# Patient Record
Sex: Female | Born: 1955 | Race: Black or African American | Hispanic: No | Marital: Married | Smoking: Former smoker
Health system: Southern US, Community
[De-identification: ages and names within clinical notes are randomized; demographics above are authoritative.]

## PROBLEM LIST (undated history)

## (undated) DIAGNOSIS — J439 Emphysema, unspecified: Secondary | ICD-10-CM

## (undated) DIAGNOSIS — I10 Essential (primary) hypertension: Secondary | ICD-10-CM

## (undated) DIAGNOSIS — B019 Varicella without complication: Secondary | ICD-10-CM

## (undated) DIAGNOSIS — E785 Hyperlipidemia, unspecified: Secondary | ICD-10-CM

## (undated) HISTORY — DX: Hyperlipidemia, unspecified: E78.5

## (undated) HISTORY — PX: DIAGNOSTIC LAPAROSCOPY: SUR761

## (undated) HISTORY — PX: GALLBLADDER SURGERY: SHX652

## (undated) HISTORY — PX: TONSILLECTOMY: SHX5217

## (undated) HISTORY — PX: TONSILLECTOMY: SUR1361

## (undated) HISTORY — PX: CHOLECYSTECTOMY: SHX55

## (undated) HISTORY — DX: Varicella without complication: B01.9

## (undated) HISTORY — DX: Essential (primary) hypertension: I10

## (undated) HISTORY — DX: Emphysema, unspecified: J43.9

---
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
## 1998-02-06 HISTORY — PX: ABDOMINAL HYSTERECTOMY: SHX81

## 2002-02-06 HISTORY — PX: OVARIAN CYST SURGERY: SHX726

## 2017-12-11 ENCOUNTER — Encounter: Payer: Self-pay | Admitting: Family Medicine

## 2017-12-11 ENCOUNTER — Ambulatory Visit (INDEPENDENT_AMBULATORY_CARE_PROVIDER_SITE_OTHER): Payer: 59 | Admitting: Family Medicine

## 2017-12-11 VITALS — BP 98/68 | HR 95 | Temp 98.3°F | Ht 63.0 in | Wt 163.8 lb

## 2017-12-11 DIAGNOSIS — I10 Essential (primary) hypertension: Secondary | ICD-10-CM

## 2017-12-11 DIAGNOSIS — E785 Hyperlipidemia, unspecified: Secondary | ICD-10-CM

## 2017-12-11 DIAGNOSIS — R5383 Other fatigue: Secondary | ICD-10-CM

## 2017-12-11 DIAGNOSIS — R0981 Nasal congestion: Secondary | ICD-10-CM

## 2017-12-11 DIAGNOSIS — R070 Pain in throat: Secondary | ICD-10-CM

## 2017-12-11 DIAGNOSIS — E559 Vitamin D deficiency, unspecified: Secondary | ICD-10-CM

## 2017-12-11 LAB — COMPREHENSIVE METABOLIC PANEL
ALBUMIN: 4.1 g/dL (ref 3.5–5.2)
ALT: 14 U/L (ref 0–35)
AST: 29 U/L (ref 0–37)
Alkaline Phosphatase: 62 U/L (ref 39–117)
BILIRUBIN TOTAL: 1.4 mg/dL — AB (ref 0.2–1.2)
BUN: 15 mg/dL (ref 6–23)
CALCIUM: 10.2 mg/dL (ref 8.4–10.5)
CO2: 32 mEq/L (ref 19–32)
Chloride: 103 mEq/L (ref 96–112)
Creatinine, Ser: 0.98 mg/dL (ref 0.40–1.20)
GFR: 73.3 mL/min (ref >60.00)
Glucose, Bld: 127 mg/dL — ABNORMAL HIGH (ref 70–99)
Potassium: 3 mEq/L — ABNORMAL LOW (ref 3.5–5.1)
SODIUM: 142 meq/L (ref 135–145)
TOTAL PROTEIN: 7.2 g/dL (ref 6.0–8.3)

## 2017-12-11 LAB — CBC WITH DIFFERENTIAL/PLATELET
BASOS ABS: 0.1 10*3/uL (ref 0.0–0.1)
Basophils Relative: 1.4 % (ref 0.0–3.0)
EOS ABS: 0.1 10*3/uL (ref 0.0–0.7)
Eosinophils Relative: 1 % (ref 0.0–5.0)
HEMATOCRIT: 45.9 % (ref 36.0–46.0)
HEMOGLOBIN: 15.6 g/dL — AB (ref 12.0–15.0)
LYMPHS PCT: 26.9 % (ref 12.0–46.0)
Lymphs Abs: 2.5 10*3/uL (ref 0.7–4.0)
MCHC: 33.9 g/dL (ref 30.0–36.0)
MCV: 92.3 fl (ref 78.0–100.0)
MONO ABS: 0.4 10*3/uL (ref 0.1–1.0)
Monocytes Relative: 4.2 % (ref 3.0–12.0)
Neutro Abs: 6.2 10*3/uL (ref 1.4–7.7)
Neutrophils Relative %: 66.5 % (ref 43.0–77.0)
Platelets: 333 10*3/uL (ref 150.0–400.0)
RBC: 4.98 Mil/uL (ref 3.87–5.11)
RDW: 13.7 % (ref 11.5–15.5)
WBC: 9.4 10*3/uL (ref 4.0–10.5)

## 2017-12-11 LAB — LIPID PANEL
CHOLESTEROL: 129 mg/dL (ref 0–200)
HDL: 39.8 mg/dL (ref >39.00)
LDL CALC: 62 mg/dL (ref 0–99)
NonHDL: 89.42
TRIGLYCERIDES: 138 mg/dL (ref 0.0–149.0)
Total CHOL/HDL Ratio: 3
VLDL: 27.6 mg/dL (ref 0.0–40.0)

## 2017-12-11 LAB — B12 AND FOLATE PANEL
Folate: 14 ng/mL (ref >5.9)
VITAMIN B 12: 382 pg/mL (ref 211–911)

## 2017-12-11 LAB — VITAMIN D 25 HYDROXY (VIT D DEFICIENCY, FRACTURES): VITD: 16.28 ng/mL — AB (ref 30.00–100.00)

## 2017-12-11 NOTE — Patient Instructions (Addendum)
Continue claritin and flonase to help post nasal drip  ENT referral done for furhter evaluation of throat pain  Monitor BP at home and follow up here in 1-2 weeks with log of readings.

## 2017-12-11 NOTE — Progress Notes (Signed)
Subjective:    Patient ID: Valerie Larson, female    DOB: 04-23-1955, 62 y.o.   MRN: [id number]  HPI  Patient presents to clinic to establish with new PCP.  Patient has a history of essential hypertension for which she takes amlodipine hydrochlorothiazide.  Patient has history of hyperlipidemia for which she takes atorvastatin.  Patient also uses Flonase and Claritin for seasonal allergies.    Her main concern today is pain in the back of her throat.  She went to urgent care at [facility] clinic last week and was diagnosed with sinus infection.  She was treated with amoxicillin course.  Patient states back down deep in her throat still feels pain, concerned she still has lesions back there and wonders if she has some sort of throat cancer.  Patient has no fever or chills.  Patient's appetite has been normal.  Patient states she stopped using her Flonase and Claritin during the time she was taking her amoxicillin, will restart Flonase and Claritin today.  Past medical history, surgical history, social history and family history reviewed and updated in chart. Patient Active Problem List   Diagnosis Date Noted  . Essential hypertension 12/11/2017  . Hyperlipidemia 12/11/2017   Past Medical History:  Diagnosis Date  . Chicken pox   . Emphysema of lung ([place])   . Hyperlipidemia   . Hypertension    Social History   Tobacco Use  . Smoking status: Former Research scientist (life sciences)  . Smokeless tobacco: Never Used  Substance Use Topics  . Alcohol use: Never    Frequency: Never   Past Surgical History:  Procedure Laterality Date  . ABDOMINAL HYSTERECTOMY  2000  . APPENDECTOMY  1974  . GALLBLADDER SURGERY    . OVARIAN CYST SURGERY  2004  . TONSILLECTOMY     Family History  Problem Relation Age of Onset  . Cancer Mother   . Hyperlipidemia Mother   . Hypertension Mother   . COPD Father   . Diabetes Father   . Hyperlipidemia Father   . Hypertension Sister   . Hypertension Brother     Review of  Systems  Constitutional: Negative for chills, fatigue and fever.  HENT: Negative for congestion, ear pain, sinus pain. +pain in throat.   Eyes: Negative.   Respiratory: Negative for cough, shortness of breath and wheezing.   Cardiovascular: Negative for chest pain, palpitations and leg swelling.  Gastrointestinal: Negative for abdominal pain, diarrhea, nausea and vomiting.  Genitourinary: Negative for dysuria, frequency and urgency.  Musculoskeletal: Negative for arthralgias and myalgias.  Skin: Negative for color change, pallor and rash.  Neurological: Negative for syncope, light-headedness and headaches.  Psychiatric/Behavioral: The patient is not nervous/anxious.       Objective:   Physical Exam  Constitutional: She is oriented to person, place, and time. She appears well-nourished. No distress.  HENT:  Head: Normocephalic and atraumatic.  Right Ear: Tympanic membrane, external ear and ear canal normal.  Left Ear: Tympanic membrane, external ear and ear canal normal.  Nose: Rhinorrhea present.  Mouth/Throat: No oropharyngeal exudate or tonsillar abscesses.  Mild post nasal drip. Patient has had tonsillectomy.   Eyes: Conjunctivae and EOM are normal. No scleral icterus.  Neck: Neck supple. No tracheal deviation present.  Cardiovascular: Normal rate and regular rhythm.  Pulmonary/Chest: Effort normal and breath sounds normal. No respiratory distress.  Musculoskeletal: She exhibits no edema.  Gait normal  Lymphadenopathy:    She has no cervical adenopathy.  Neurological: She is alert and oriented to person,  place, and time.  Skin: Skin is warm and dry. She is not diaphoretic. No erythema. No pallor.  Psychiatric: She has a normal mood and affect. Her behavior is normal.  Nursing note and vitals reviewed.     Vitals:   12/11/17 0856  BP: 98/68  Pulse: 95  Temp: 98.3 F (36.8 C)  SpO2: 95%   Assessment & Plan:   Essential hypertension-patient will continue amlodipine  and hydrochlorothiazide.  Prescription written for blood pressure cuff to monitor pressures at home due to BP being slightly on lower end in clinic today.  She will return to clinic in 1 week with log of readings.  Hyperlipidemia - Patient will continue atorvastatin.  Lipid panel drawn and blood work.  Nasal congestion-patient encouraged to restart Flonase and Claritin.  Pain in throat- I am unable to see any obvious throat abnormality on my exam, but I cannot see down into the back of throat.  We will give patient ENT referral for further evaluation of throat pain.  Patient reassured the amoxicillin she was treated with cover strep throat.  Offered to do repeat throat swab and culture in clinic today, patient declines  Fatigue- CBC, iron studies., CMP, vitamin D, B12, thyroid panel will be checked and lab work today.  Patient will return to clinic in 1 week for recheck of blood pressures and any medication adjustments will be done at that time based on log of her BP readings at home.

## 2017-12-12 LAB — IRON,TIBC AND FERRITIN PANEL
%SAT: 55 % — AB (ref 16–45)
FERRITIN: 262 ng/mL (ref 16–288)
Iron: 143 ug/dL (ref 45–160)
TIBC: 258 ug/dL (ref 250–450)

## 2017-12-12 LAB — THYROID PANEL WITH TSH
Free Thyroxine Index: 3.9 — ABNORMAL HIGH (ref 1.4–3.8)
T3 Uptake: 31 % (ref 22–35)
T4 TOTAL: 12.5 ug/dL — AB (ref 5.1–11.9)
TSH: 1.77 mIU/L (ref 0.40–4.50)

## 2017-12-12 MED ORDER — VITAMIN D (ERGOCALCIFEROL) 1.25 MG (50000 UNIT) PO CAPS: 50000.0000 [IU] | ORAL_CAPSULE | ORAL | 0 refills | Status: DC

## 2017-12-12 NOTE — Addendum Note (Signed)
Addended by: Philis Nettle on: 12/12/2017 01:03 PM   Modules accepted: Orders

## 2017-12-17 NOTE — Progress Notes (Signed)
Subjective:    Patient ID: Valerie Larson, female    DOB: [birth year], 62 y.o.   MRN: [id number]  HPI  Patient presents to clinic to follow up on BP and Potassium levels.  Patient states she takes amlodipine 5mg  daily and atorvastatin 10mg  in a combination pill for her BP control. At last visit she was unclear of her BP med dose, but today we confirmed it is 5mg .   She is supposed to take 71meq of potassium supplement daily, but states there are days where she does not take it because she has a hard time swallowing these pills because they are larger.  Patient would like potassium and a powder form instead.  She still continues to have throat pain in the back of her throat, feels like something is in the back of her throat.  She has ENT appointment scheduled for next week, patient is pleased she was able to get in quickly.  Vit D was low in lab work, she has started Vit D supplement, has taken 1 dose so far  Patient would also like mammogram order, she wants to get it scheduled before the end of the year.  BMP Latest Ref Rng & Units 12/11/2017  Glucose 70 - 99 mg/dL 127(H)  BUN 6 - 23 mg/dL 15  Creatinine 0.40 - 1.20 mg/dL 0.98  Sodium 135 - 145 mEq/L 142  Potassium 3.5 - 5.1 mEq/L 3.0(L)  Chloride 96 - 112 mEq/L 103  CO2 19 - 32 mEq/L 32  Calcium 8.4 - 10.5 mg/dL 10.2    Patient Active Problem List   Diagnosis Date Noted  . Essential hypertension 12/11/2017  . Hyperlipidemia 12/11/2017   Social History   Tobacco Use  . Smoking status: Former Research scientist (life sciences)  . Smokeless tobacco: Never Used  Substance Use Topics  . Alcohol use: Never    Frequency: Never    Review of Systems   Constitutional: Negative for chills, fatigue and fever.  HENT: Negative for congestion, ear pain, sinus pain. +pain in back of throat   Eyes: Negative.   Respiratory: Negative for cough, shortness of breath and wheezing.   Cardiovascular: Negative for chest pain, palpitations and leg swelling.    Gastrointestinal: Negative for abdominal pain, diarrhea, nausea and vomiting.  Genitourinary: Negative for dysuria, frequency and urgency.  Musculoskeletal: Negative for arthralgias and myalgias.  Skin: Negative for color change, pallor and rash.  Neurological: Negative for syncope, light-headedness and headaches.  Psychiatric/Behavioral: The patient is not nervous/anxious.       Objective:   Physical Exam  Constitutional: She appears well-developed and well-nourished. No distress.  Head: Normocephalic and atraumatic.  Eyes: EOM are normal. No scleral icterus.  Nose/throat: No redness or swelling seen in back of throat from what I can visualize with my light Neck: Normal range of motion. Neck supple. No tracheal deviation present.  Cardiovascular: Normal rate, regular rhythm and normal heart sounds.  Declines breast exam in clinic today Pulmonary/Chest: Effort normal and breath sounds normal. No respiratory distress. She has no wheezes. She has no rales.  Neurological: She is alert and oriented to person, place, and time. Gait normal  Skin: Skin is warm and dry. No pallor.  Psychiatric: She has a normal mood and affect. Her behavior is normal. Thought content normal.   Nursing note and vitals reviewed.    Vitals:   12/18/17 1001  BP: 128/74  Pulse: 88  Temp: 98.4 F (36.9 C)  SpO2: 95%   Assessment & Plan:  Vitamin D deficiency-patient will continue vitamin D supplement 50,000 units once per week, will plan to recheck vitamin D levels at her next follow-up visit in approximately 2 months.  Hypokalemia- patient has long history of hypokalemia.  She often cannot swallow the potassium pills due to their size, so we will change formula to a powder that she mixes with liquid that is easy to swallow.  We will recheck basic metabolic panel in clinic today to be sure potassium levels are remaining stable.  Essential hypertension-blood pressure stable on current amlodipine dose of 5  mg.  She will continue this dose.  Throat pain-patient has upcoming appointment with ENT next week.  Mammogram order placed, patient aware she will be contacted to set up appointment.  Follow-up here in approximately 2 months for recheck of chronic conditions.  Patient aware she can call clinic anytime if issues arise.

## 2017-12-18 ENCOUNTER — Encounter: Payer: Self-pay | Admitting: Family Medicine

## 2017-12-18 ENCOUNTER — Ambulatory Visit: Payer: 59 | Admitting: Family Medicine

## 2017-12-18 VITALS — BP 128/74 | HR 88 | Temp 98.4°F | Ht 63.0 in | Wt 167.0 lb

## 2017-12-18 DIAGNOSIS — Z1231 Encounter for screening mammogram for malignant neoplasm of breast: Secondary | ICD-10-CM

## 2017-12-18 DIAGNOSIS — I10 Essential (primary) hypertension: Secondary | ICD-10-CM

## 2017-12-18 DIAGNOSIS — E559 Vitamin D deficiency, unspecified: Secondary | ICD-10-CM | POA: Diagnosis not present

## 2017-12-18 DIAGNOSIS — E876 Hypokalemia: Secondary | ICD-10-CM | POA: Diagnosis not present

## 2017-12-18 DIAGNOSIS — R070 Pain in throat: Secondary | ICD-10-CM

## 2017-12-18 LAB — BASIC METABOLIC PANEL
BUN: 16 mg/dL (ref 6–23)
CO2: 31 meq/L (ref 19–32)
CREATININE: 0.94 mg/dL (ref 0.40–1.20)
Calcium: 9.8 mg/dL (ref 8.4–10.5)
Chloride: 107 mEq/L (ref 96–112)
GFR: 76.91 mL/min (ref >60.00)
GLUCOSE: 118 mg/dL — AB (ref 70–99)
POTASSIUM: 3.5 meq/L (ref 3.5–5.1)
SODIUM: 144 meq/L (ref 135–145)

## 2017-12-18 MED ORDER — POTASSIUM CHLORIDE 20 MEQ PO PACK: 20.0000 meq | PACK | ORAL | 5 refills | Status: DC

## 2018-01-15 ENCOUNTER — Other Ambulatory Visit: Payer: Self-pay | Admitting: Otolaryngology

## 2018-01-15 DIAGNOSIS — R220 Localized swelling, mass and lump, head: Secondary | ICD-10-CM

## 2018-01-15 DIAGNOSIS — R1310 Dysphagia, unspecified: Secondary | ICD-10-CM

## 2018-01-15 DIAGNOSIS — F458 Other somatoform disorders: Secondary | ICD-10-CM

## 2018-02-07 ENCOUNTER — Ambulatory Visit
Admission: RE | Admit: 2018-02-07 | Discharge: 2018-02-07 | Disposition: A | Discharge status: Home / Self Care | Payer: Medicare Other | Source: Ambulatory Visit | Attending: Otolaryngology | Admitting: Otolaryngology

## 2018-02-07 DIAGNOSIS — R220 Localized swelling, mass and lump, head: Secondary | ICD-10-CM | POA: Diagnosis not present

## 2018-02-07 DIAGNOSIS — R1310 Dysphagia, unspecified: Secondary | ICD-10-CM | POA: Insufficient documentation

## 2018-02-07 DIAGNOSIS — F458 Other somatoform disorders: Secondary | ICD-10-CM | POA: Insufficient documentation

## 2018-02-07 DIAGNOSIS — J439 Emphysema, unspecified: Secondary | ICD-10-CM | POA: Diagnosis not present

## 2018-02-07 LAB — POCT I-STAT CREATININE: Creatinine, Ser: 1 mg/dL (ref 0.44–1.00)

## 2018-02-07 MED ORDER — IOHEXOL 300 MG/ML  SOLN
75.0000 mL | Freq: Once | INTRAMUSCULAR | Status: AC | PRN
  Administered 2018-02-07: 75 mL via INTRAVENOUS

## 2018-02-08 DIAGNOSIS — J039 Acute tonsillitis, unspecified: Secondary | ICD-10-CM | POA: Diagnosis not present

## 2018-02-08 DIAGNOSIS — J301 Allergic rhinitis due to pollen: Secondary | ICD-10-CM | POA: Diagnosis not present

## 2018-02-11 ENCOUNTER — Encounter: Payer: Self-pay | Admitting: Family Medicine

## 2018-03-05 ENCOUNTER — Ambulatory Visit (INDEPENDENT_AMBULATORY_CARE_PROVIDER_SITE_OTHER): Payer: Medicare Other | Admitting: Family Medicine

## 2018-03-05 ENCOUNTER — Encounter: Payer: Self-pay | Admitting: Family Medicine

## 2018-03-05 VITALS — BP 132/90 | HR 81 | Temp 98.5°F | Resp 16 | Ht 63.0 in | Wt 162.0 lb

## 2018-03-05 DIAGNOSIS — J069 Acute upper respiratory infection, unspecified: Secondary | ICD-10-CM | POA: Diagnosis not present

## 2018-03-05 DIAGNOSIS — B9789 Other viral agents as the cause of diseases classified elsewhere: Secondary | ICD-10-CM

## 2018-03-05 MED ORDER — ALBUTEROL SULFATE HFA 108 (90 BASE) MCG/ACT IN AERS: 2.0000 | INHALATION_SPRAY | Freq: Four times a day (QID) | RESPIRATORY_TRACT | 2 refills | Status: DC | PRN

## 2018-03-05 NOTE — Progress Notes (Signed)
Subjective:    Patient ID: Valerie Larson, female    DOB: [birth year], 63 y.o.   MRN: [id number]  HPI   Patient presents to clinic complaining of dry cough, feeling congested in chest for the past 3 to 4 days.  Denies any fever or chills.  Denies body aches.  Denies feeling short of breath. Denies nausea/vomiting or diarrhea.  States she has been using Mucinex with some help in reducing cough.  Cough seems worse at night, wheezes at times when laying down.   Patient Active Problem List   Diagnosis Date Noted  . Essential hypertension 12/11/2017  . Hyperlipidemia 12/11/2017   Social History   Tobacco Use  . Smoking status: Former Research scientist (life sciences)  . Smokeless tobacco: Never Used  Substance Use Topics  . Alcohol use: Never    Frequency: Never   Review of Systems  Constitutional: Negative for chills, and fever. Some tiredness, thinks related to cough.  HENT: Negative for congestion, ear pain, sinus pain and sore throat.   Eyes: Negative.   Respiratory: +cough, some chest congestion, some wheezing at night. Negative SOB. Cardiovascular: Negative for chest pain, palpitations and leg swelling.  Gastrointestinal: Negative for abdominal pain, diarrhea, nausea and vomiting.  Genitourinary: Negative for dysuria, frequency and urgency.  Musculoskeletal: Negative for arthralgias and myalgias.  Skin: Negative for color change, pallor and rash.  Neurological: Negative for syncope, light-headedness and headaches.  Psychiatric/Behavioral: The patient is not nervous/anxious.       Objective:   Physical Exam Vitals signs and nursing note reviewed.  Constitutional:      General: She is not in acute distress.    Appearance: She is not ill-appearing, toxic-appearing or diaphoretic.  HENT:     Head: Normocephalic and atraumatic.     Nose: Rhinorrhea (some clear nasal drainage) present.     Mouth/Throat:     Mouth: Mucous membranes are moist.     Pharynx: No oropharyngeal exudate or posterior  oropharyngeal erythema.  Eyes:     General: No scleral icterus.       Right eye: No discharge.        Left eye: No discharge.     Extraocular Movements: Extraocular movements intact.     Conjunctiva/sclera: Conjunctivae normal.     Pupils: Pupils are equal, round, and reactive to light.  Neck:     Musculoskeletal: Neck supple. No neck rigidity.  Cardiovascular:     Rate and Rhythm: Normal rate and regular rhythm.  Pulmonary:     Effort: Pulmonary effort is normal. No respiratory distress.     Breath sounds: No wheezing, rhonchi or rales.  Musculoskeletal:     Right lower leg: No edema.     Left lower leg: No edema.  Lymphadenopathy:     Cervical: No cervical adenopathy.  Skin:    General: Skin is warm and dry.     Coloration: Skin is not pale.  Neurological:     Mental Status: She is alert and oriented to person, place, and time.  Psychiatric:        Mood and Affect: Mood normal.        Behavior: Behavior normal.    Vitals:   03/05/18 1342  BP: 132/90  Pulse: 81  Resp: 16  Temp: 98.5 F (36.9 C)  SpO2: 95%      Assessment & Plan:   Viral URI with cough- patient lungs are clear on exam today.  However, due to report of some wheezing night when laying  we will prescribe albuterol inhaler to use as needed.  Advised to continue taking Mucinex as she has been doing, this is a great over-the-counter choice to help reduce cough and thin secretions.  Advised to get plenty of rest, increase fluid intake and do good handwashing.   Symptoms should resolve over the next 1 to 2 weeks.  Advised if they are not improving in this timeframe, to call office for reevaluation and chest x-ray imaging.  Patient otherwise will keep her already planned follow-up in February 2020.

## 2018-03-15 ENCOUNTER — Encounter: Payer: Self-pay | Admitting: Radiology

## 2018-03-15 ENCOUNTER — Ambulatory Visit
Admission: RE | Admit: 2018-03-15 | Discharge: 2018-03-15 | Disposition: A | Discharge status: Home / Self Care | Payer: Medicare Other | Source: Ambulatory Visit | Attending: Family Medicine | Admitting: Family Medicine

## 2018-03-15 DIAGNOSIS — Z1231 Encounter for screening mammogram for malignant neoplasm of breast: Secondary | ICD-10-CM | POA: Insufficient documentation

## 2018-03-20 ENCOUNTER — Ambulatory Visit: Payer: 59 | Admitting: Family Medicine

## 2018-05-13 ENCOUNTER — Other Ambulatory Visit: Payer: Self-pay | Admitting: Family Medicine

## 2018-05-13 DIAGNOSIS — B9789 Other viral agents as the cause of diseases classified elsewhere: Principal | ICD-10-CM

## 2018-05-13 DIAGNOSIS — J069 Acute upper respiratory infection, unspecified: Secondary | ICD-10-CM

## 2018-06-08 ENCOUNTER — Other Ambulatory Visit: Payer: Self-pay | Admitting: Family Medicine

## 2018-06-08 DIAGNOSIS — I10 Essential (primary) hypertension: Secondary | ICD-10-CM

## 2018-06-08 DIAGNOSIS — E7800 Pure hypercholesterolemia, unspecified: Secondary | ICD-10-CM

## 2018-06-17 DIAGNOSIS — K219 Gastro-esophageal reflux disease without esophagitis: Secondary | ICD-10-CM | POA: Diagnosis not present

## 2018-06-17 DIAGNOSIS — J301 Allergic rhinitis due to pollen: Secondary | ICD-10-CM | POA: Diagnosis not present

## 2018-06-17 DIAGNOSIS — R0683 Snoring: Secondary | ICD-10-CM | POA: Diagnosis not present

## 2018-06-27 DIAGNOSIS — G4730 Sleep apnea, unspecified: Secondary | ICD-10-CM | POA: Diagnosis not present

## 2018-08-29 ENCOUNTER — Other Ambulatory Visit: Payer: Self-pay

## 2018-08-29 DIAGNOSIS — Z20822 Contact with and (suspected) exposure to covid-19: Secondary | ICD-10-CM

## 2018-08-29 DIAGNOSIS — R6889 Other general symptoms and signs: Secondary | ICD-10-CM | POA: Diagnosis not present

## 2018-08-31 ENCOUNTER — Other Ambulatory Visit: Payer: Self-pay | Admitting: Family Medicine

## 2018-09-01 ENCOUNTER — Telehealth: Payer: Self-pay

## 2018-09-01 LAB — NOVEL CORONAVIRUS, NAA: SARS-CoV-2, NAA: NOT DETECTED

## 2018-09-01 NOTE — Telephone Encounter (Signed)
Pt called for test results. Pt informed that results are not back. Pt sent sign up link for MyChart.

## 2018-09-02 ENCOUNTER — Telehealth: Payer: Self-pay

## 2018-09-02 ENCOUNTER — Other Ambulatory Visit: Payer: Self-pay | Admitting: Family Medicine

## 2018-09-02 DIAGNOSIS — E7800 Pure hypercholesterolemia, unspecified: Secondary | ICD-10-CM

## 2018-09-02 DIAGNOSIS — I10 Essential (primary) hypertension: Secondary | ICD-10-CM

## 2018-09-02 NOTE — Telephone Encounter (Signed)
Pt called [facility] Called and spoke to pt.    Pt said that her husband tested positive for COVID and the only symptoms that her husband had was a fever.  Pt wants to be advised since she tested negative for COVID.  Pt denied having any symptoms.  Only allergy symptoms:  nasal congestion, sinus pressure and drainage.

## 2018-09-02 NOTE — Telephone Encounter (Signed)
Called Pt she stated she understood with no questions. Pt also stated she didn't need a visit at this time, she is not having any symptoms

## 2018-09-02 NOTE — Telephone Encounter (Signed)
Called and spoke to pt.    Pt said that her husband tested positive for COVID and the only symptoms that her husband had was a fever.  Pt wants to be advised since she tested negative for COVID.  Pt denied having any symptoms.  Only allergy symptoms:  nasal congestion, sinus pressure and drainage.

## 2018-09-02 NOTE — Telephone Encounter (Signed)
Copied from [place] [id number]. Topic: General - Other >> Sep 02, 2018 10:11 AM Celene Kras A wrote: Reason for CRM: Pt called stating her husband tested positive although she tested nedgative for covid. Pt is requesting advice on how to handle this. Please advise.

## 2018-09-02 NOTE — Telephone Encounter (Signed)
We can do a visit to discuss her concerns  If she is negative and fever free, then she can wear a mask if out of the home, washing hands, social distancing. If she wants to err on extreme side of caution she can quarantine the same time frame as husband.   I do not think re-testing her is needed unless she is feeling worse or having other new symptoms

## 2018-09-02 NOTE — Telephone Encounter (Signed)
Her results are negative and looks like they were released to her mychart account also.

## 2018-09-02 NOTE — Telephone Encounter (Signed)
I called Pt and her concerns are that her husband is positive. Should she get re tested since her results were negative. Pt wanted to know what should she be doing to keep herself safe. We basically talked about wearing a mask, social distance, disinfectant be careful.

## 2018-09-28 ENCOUNTER — Other Ambulatory Visit: Payer: Self-pay | Admitting: Family Medicine

## 2018-10-27 ENCOUNTER — Other Ambulatory Visit: Payer: Self-pay | Admitting: Family Medicine

## 2018-12-01 ENCOUNTER — Other Ambulatory Visit: Payer: Self-pay | Admitting: Family Medicine

## 2018-12-01 DIAGNOSIS — I10 Essential (primary) hypertension: Secondary | ICD-10-CM

## 2018-12-01 DIAGNOSIS — E7800 Pure hypercholesterolemia, unspecified: Secondary | ICD-10-CM

## 2018-12-02 ENCOUNTER — Other Ambulatory Visit: Payer: Self-pay | Admitting: Family Medicine

## 2018-12-04 DIAGNOSIS — H353132 Nonexudative age-related macular degeneration, bilateral, intermediate dry stage: Secondary | ICD-10-CM | POA: Diagnosis not present

## 2018-12-26 ENCOUNTER — Other Ambulatory Visit: Payer: Self-pay | Admitting: Family Medicine

## 2019-01-20 ENCOUNTER — Encounter: Payer: Self-pay | Admitting: Family

## 2019-01-20 ENCOUNTER — Other Ambulatory Visit: Payer: Self-pay

## 2019-01-20 ENCOUNTER — Ambulatory Visit (INDEPENDENT_AMBULATORY_CARE_PROVIDER_SITE_OTHER): Payer: Medicare Other | Admitting: Family

## 2019-01-20 VITALS — Ht 63.0 in | Wt 162.0 lb

## 2019-01-20 DIAGNOSIS — I10 Essential (primary) hypertension: Secondary | ICD-10-CM | POA: Diagnosis not present

## 2019-01-20 DIAGNOSIS — Z1211 Encounter for screening for malignant neoplasm of colon: Secondary | ICD-10-CM

## 2019-01-20 DIAGNOSIS — Z716 Tobacco abuse counseling: Secondary | ICD-10-CM | POA: Insufficient documentation

## 2019-01-20 DIAGNOSIS — Z0000 Encounter for general adult medical examination without abnormal findings: Secondary | ICD-10-CM | POA: Insufficient documentation

## 2019-01-20 DIAGNOSIS — Z1231 Encounter for screening mammogram for malignant neoplasm of breast: Secondary | ICD-10-CM | POA: Diagnosis not present

## 2019-01-20 DIAGNOSIS — E7800 Pure hypercholesterolemia, unspecified: Secondary | ICD-10-CM

## 2019-01-20 DIAGNOSIS — J069 Acute upper respiratory infection, unspecified: Secondary | ICD-10-CM

## 2019-01-20 DIAGNOSIS — Z1239 Encounter for other screening for malignant neoplasm of breast: Secondary | ICD-10-CM | POA: Insufficient documentation

## 2019-01-20 MED ORDER — ALBUTEROL SULFATE HFA 108 (90 BASE) MCG/ACT IN AERS: INHALATION_SPRAY | RESPIRATORY_TRACT | 3 refills | Status: DC

## 2019-01-20 MED ORDER — HYDROCHLOROTHIAZIDE 25 MG PO TABS: 25.0000 mg | ORAL_TABLET | Freq: Every day | ORAL | 1 refills | Status: DC

## 2019-01-20 MED ORDER — AMLODIPINE-ATORVASTATIN 5-10 MG PO TABS: 1.0000 | ORAL_TABLET | Freq: Every day | ORAL | 1 refills | Status: DC

## 2019-01-20 NOTE — Assessment & Plan Note (Signed)
Overdue, GI referral placed

## 2019-01-20 NOTE — Progress Notes (Signed)
Virtual Visit via Video Note  I connected with@  on 01/20/19 at  2:00 PM EST by a video enabled telemedicine application and verified that I am speaking with the correct person using two identifiers.  Location patient: home Location provider:home office Persons participating in the virtual visit: patient, provider  I discussed the limitations of evaluation and management by telemedicine and the availability of in person appointments. The patient expressed understanding and agreed to proceed.   HPI: Establish care, follow up.  Feels well today, no complaints Would like refills.  COPD - feels well controlled. Once and while needs inhaler.   HTN- stays around 128/78. Also takes KCl.   Denies exertional chest pain or pressure, numbness or tingling radiating to left arm or jaw, palpitations, dizziness, frequent headaches, changes in vision, or shortness of breath.    HLD-compliant with medication   Overdue for mammogram, colonoscopy, bone density, pneumonia vaccine.   ROS: See pertinent positives and negatives per HPI.  Past Medical History:  Diagnosis Date  . Chicken pox   . Emphysema of lung ([place])   . Hyperlipidemia   . Hypertension     Past Surgical History:  Procedure Laterality Date  . ABDOMINAL HYSTERECTOMY  2000   complete hysterectomy, no ovaries.   . APPENDECTOMY  1974  . GALLBLADDER SURGERY    . OVARIAN CYST SURGERY  2004  . TONSILLECTOMY      Family History  Problem Relation Age of Onset  . Cancer Mother   . Hyperlipidemia Mother   . Hypertension Mother   . Ovarian cancer Mother 42  . COPD Father   . Diabetes Father   . Hyperlipidemia Father   . Hypertension Sister   . Hypertension Brother     SOCIAL HX: former smoker   Current Outpatient Medications:  .  albuterol (VENTOLIN HFA) 108 (90 Base) MCG/ACT inhaler, TAKE 2 PUFFS BY MOUTH EVERY 6 HOURS AS NEEDED FOR WHEEZE OR SHORTNESS OF BREATH, Disp: 18 g, Rfl: 3 .  amLODipine-atorvastatin (CADUET)  5-10 MG tablet, Take 1 tablet by mouth daily., Disp: 90 tablet, Rfl: 1 .  azelastine (ASTELIN) 0.1 % nasal spray, SMARTSIG:2 Spray(s) Both Nares Every 12 Hours PRN, Disp: , Rfl:  .  hydrochlorothiazide (HYDRODIURIL) 25 MG tablet, Take 1 tablet (25 mg total) by mouth daily., Disp: 90 tablet, Rfl: 1 .  loratadine (CLARITIN) 10 MG tablet, Take 10 mg by mouth daily., Disp: , Rfl:  .  Multiple Vitamins-Minerals (PRESERVISION AREDS 2+MULTI VIT PO), Take 1 tablet by mouth., Disp: , Rfl:  .  potassium chloride (KLOR-CON) 20 MEQ packet, Take 20 mEq by mouth every other day., Disp: 30 packet, Rfl: 5 .  omeprazole (PRILOSEC) 40 MG capsule, SMARTSIG:1 Pill By Mouth Every Morning, Disp: , Rfl:   EXAM:  VITALS per patient if applicable: Today's Vitals   01/20/19 1350  Weight: 162 lb (73.5 kg)  Height: 5\' 3"  (1.6 m)   Body mass index is 28.7 kg/m.  BP Readings from Last 3 Encounters:  03/05/18 132/90  12/18/17 128/74  12/11/17 98/68   GENERAL: alert, oriented, appears well and in no acute distress  HEENT: atraumatic, conjunttiva clear, no obvious abnormalities on inspection of external nose and ears  NECK: normal movements of the head and neck  LUNGS: on inspection no signs of respiratory distress, breathing rate appears normal, no obvious gross SOB, gasping or wheezing  CV: no obvious cyanosis  MS: moves all visible extremities without noticeable abnormality  PSYCH/NEURO: pleasant and cooperative, no  obvious depression or anxiety, speech and thought processing grossly intact  ASSESSMENT AND PLAN:  Discussed the following assessment and plan:  Essential hypertension - Plan: DG Bone Density, CBC with Differential/Platelet, Comprehensive metabolic panel-FUTURE, Hemoglobin A1c, Lipid panel-future, TSH-future, T3, free, T4, free, VITAMIN D 25 Hydroxy (Vit-D Deficiency, Fractures)  Encounter for screening mammogram for malignant neoplasm of breast - Plan: 3D mammogram- MM SCREENING BREAST  TOMO BILATERAL, DG Bone Density  Screen for colon cancer - Plan: Colonoscopy-Ambulatory referral to Gastroenterology-45 to 75 years  Problem List Items Addressed This Visit      Cardiovascular and Mediastinum   Essential hypertension - Primary    Appears controlled, advised patient to continue check at home.  Refilled medications and pending lab work.      Relevant Orders   DG Bone Density   CBC with Differential/Platelet   Comprehensive metabolic panel-FUTURE   Hemoglobin A1c   Lipid panel-future   TSH-future   T3, free   T4, free   VITAMIN D 25 Hydroxy (Vit-D Deficiency, Fractures)     Other   Screening for breast cancer    Ordered mammogram and bone density, patient understands to schedule both      Relevant Orders   3D mammogram- MM SCREENING BREAST TOMO BILATERAL   DG Bone Density   Screen for colon cancer    Overdue, GI referral placed      Relevant Orders   Colonoscopy-Ambulatory referral to Gastroenterology-45 to 79 years    Of note, patient will bring immunization record at her next visit so we can rectify pneumonia vaccine  -we discussed possible serious and likely etiologies, options for evaluation and workup, limitations of telemedicine visit vs in person visit, treatment, treatment risks and precautions. Pt prefers to treat via telemedicine empirically rather then risking or undertaking an in person visit at this moment. Patient agrees to seek prompt in person care if worsening, new symptoms arise, or if is not improving with treatment.   I discussed the assessment and treatment plan with the patient. The patient was provided an opportunity to ask questions and all were answered. The patient agreed with the plan and demonstrated an understanding of the instructions.   The patient was advised to call back or seek an in-person evaluation if the symptoms worsen or if the condition fails to improve as anticipated.   Mable Paris, FNP

## 2019-01-20 NOTE — Assessment & Plan Note (Signed)
Ordered mammogram and bone density, patient understands to schedule both

## 2019-01-20 NOTE — Patient Instructions (Addendum)
We need pneumonia vaccine history; bring this to the office.   Please call call and schedule your 3D mammogram, bone density scan as discussed.   [place]  [place], [place]    Stay safe!

## 2019-01-20 NOTE — Assessment & Plan Note (Signed)
Appears controlled, advised patient to continue check at home.  Refilled medications and pending lab work.

## 2019-02-12 ENCOUNTER — Encounter: Payer: Self-pay | Admitting: Family

## 2019-02-12 ENCOUNTER — Ambulatory Visit (INDEPENDENT_AMBULATORY_CARE_PROVIDER_SITE_OTHER): Payer: Medicare Other

## 2019-02-12 ENCOUNTER — Other Ambulatory Visit (INDEPENDENT_AMBULATORY_CARE_PROVIDER_SITE_OTHER): Payer: Medicare Other

## 2019-02-12 ENCOUNTER — Other Ambulatory Visit: Payer: Self-pay

## 2019-02-12 DIAGNOSIS — I10 Essential (primary) hypertension: Secondary | ICD-10-CM | POA: Diagnosis not present

## 2019-02-12 DIAGNOSIS — Z23 Encounter for immunization: Secondary | ICD-10-CM | POA: Diagnosis not present

## 2019-02-12 LAB — CBC WITH DIFFERENTIAL/PLATELET
Basophils Absolute: 0.1 10*3/uL (ref 0.0–0.1)
Basophils Relative: 1.4 % (ref 0.0–3.0)
Eosinophils Absolute: 0.2 10*3/uL (ref 0.0–0.7)
Eosinophils Relative: 2.7 % (ref 0.0–5.0)
HCT: 43.5 % (ref 36.0–46.0)
Hemoglobin: 14.6 g/dL (ref 12.0–15.0)
Lymphocytes Relative: 45.6 % (ref 12.0–46.0)
Lymphs Abs: 3.3 10*3/uL (ref 0.7–4.0)
MCHC: 33.5 g/dL (ref 30.0–36.0)
MCV: 92.9 fl (ref 78.0–100.0)
Monocytes Absolute: 0.5 10*3/uL (ref 0.1–1.0)
Monocytes Relative: 7.1 % (ref 3.0–12.0)
Neutro Abs: 3.1 10*3/uL (ref 1.4–7.7)
Neutrophils Relative %: 43.2 % (ref 43.0–77.0)
Platelets: 329 10*3/uL (ref 150.0–400.0)
RBC: 4.68 Mil/uL (ref 3.87–5.11)
RDW: 13.2 % (ref 11.5–15.5)
WBC: 7.3 10*3/uL (ref 4.0–10.5)

## 2019-02-12 LAB — COMPREHENSIVE METABOLIC PANEL
ALT: 10 U/L (ref 0–35)
AST: 17 U/L (ref 0–37)
Albumin: 3.8 g/dL (ref 3.5–5.2)
Alkaline Phosphatase: 62 U/L (ref 39–117)
BUN: 10 mg/dL (ref 6–23)
CO2: 33 mEq/L — ABNORMAL HIGH (ref 19–32)
Calcium: 9.9 mg/dL (ref 8.4–10.5)
Chloride: 103 mEq/L (ref 96–112)
Creatinine, Ser: 0.95 mg/dL (ref 0.40–1.20)
GFR: 71.23 mL/min (ref >60.00)
Glucose, Bld: 109 mg/dL — ABNORMAL HIGH (ref 70–99)
Potassium: 3.8 mEq/L (ref 3.5–5.1)
Sodium: 140 mEq/L (ref 135–145)
Total Bilirubin: 1.2 mg/dL (ref 0.2–1.2)
Total Protein: 7 g/dL (ref 6.0–8.3)

## 2019-02-12 LAB — LIPID PANEL
Cholesterol: 146 mg/dL (ref 0–200)
HDL: 36.2 mg/dL — ABNORMAL LOW (ref >39.00)
LDL Cholesterol: 70 mg/dL (ref 0–99)
NonHDL: 109.67
Total CHOL/HDL Ratio: 4
Triglycerides: 198 mg/dL — ABNORMAL HIGH (ref 0.0–149.0)
VLDL: 39.6 mg/dL (ref 0.0–40.0)

## 2019-02-12 LAB — TSH: TSH: 3.16 u[IU]/mL (ref 0.35–4.50)

## 2019-02-12 LAB — T4, FREE: Free T4: 0.95 ng/dL (ref 0.60–1.60)

## 2019-02-12 LAB — HEMOGLOBIN A1C: Hgb A1c MFr Bld: 5.8 % (ref 4.6–6.5)

## 2019-02-12 LAB — VITAMIN D 25 HYDROXY (VIT D DEFICIENCY, FRACTURES): VITD: 14.74 ng/mL — ABNORMAL LOW (ref 30.00–100.00)

## 2019-02-12 LAB — T3, FREE: T3, Free: 3.3 pg/mL (ref 2.3–4.2)

## 2019-02-14 ENCOUNTER — Encounter: Payer: Self-pay | Admitting: *Deleted

## 2019-02-20 ENCOUNTER — Telehealth: Payer: Self-pay | Admitting: Family

## 2019-02-20 NOTE — Telephone Encounter (Signed)
I called patient & viewed lab results. They had been sent through mychart. Patient verbalized understanding.

## 2019-02-20 NOTE — Telephone Encounter (Signed)
Pt was calling for lab results from a week ago. Patient stated she called yesterday and no one called her back.

## 2019-02-26 ENCOUNTER — Other Ambulatory Visit: Payer: Medicare Other

## 2019-02-26 ENCOUNTER — Ambulatory Visit: Payer: Medicare Other

## 2019-03-17 DIAGNOSIS — Z23 Encounter for immunization: Secondary | ICD-10-CM | POA: Diagnosis not present

## 2019-04-05 DIAGNOSIS — Z23 Encounter for immunization: Secondary | ICD-10-CM | POA: Diagnosis not present

## 2019-04-15 ENCOUNTER — Ambulatory Visit (INDEPENDENT_AMBULATORY_CARE_PROVIDER_SITE_OTHER): Payer: Medicare Other

## 2019-04-15 ENCOUNTER — Other Ambulatory Visit: Payer: Self-pay

## 2019-04-15 VITALS — Ht 63.0 in | Wt 162.0 lb

## 2019-04-15 DIAGNOSIS — Z0000 Encounter for general adult medical examination without abnormal findings: Secondary | ICD-10-CM | POA: Diagnosis not present

## 2019-04-15 DIAGNOSIS — Z1211 Encounter for screening for malignant neoplasm of colon: Secondary | ICD-10-CM | POA: Diagnosis not present

## 2019-04-15 NOTE — Patient Instructions (Addendum)
Valerie Larson , Thank you for taking time to come for your Medicare Wellness Visit. I appreciate your ongoing commitment to your health goals. Please review the following plan we discussed and let me know if I can assist you in the future.   These are the goals we discussed: Goals      Patient Stated   . I want to walk more for exercise and lose weight (pt-stated)       This is a list of the screening recommended for you and due dates:  Health Maintenance  Topic Date Due  . Colon Cancer Screening  03/10/1971  . Tetanus Vaccine  03/09/1972  . DEXA scan (bone density measurement)  03/09/2018  . Pneumonia vaccines (1 of 2 - PCV13) 03/09/2018  . Mammogram  03/15/2020  . Flu Shot  Completed  .  Hepatitis C: One time screening is recommended by Center for Disease Control  (CDC) for  adults born from [birth year] through 1967.   Completed    Colonoscopy, Adult A colonoscopy is a procedure to look at the entire large intestine. This procedure is done using a long, thin, flexible tube that has a camera on the end. You may have a colonoscopy:  As a part of normal colorectal screening.  If you have certain symptoms, such as: ? A low number of red blood cells in your blood (anemia). ? Diarrhea that does not go away. ? Pain in your abdomen. ? Blood in your stool. A colonoscopy can help screen for and diagnose medical problems, including:  Tumors.  Extra tissue that grows where mucus forms (polyps).  Inflammation.  Areas of bleeding. Tell your health care provider about:  Any allergies you have.  All medicines you are taking, including vitamins, herbs, eye drops, creams, and over-the-counter medicines.  Any problems you or family members have had with anesthetic medicines.  Any blood disorders you have.  Any surgeries you have had.  Any medical conditions you have.  Any problems you have had with having bowel movements.  Whether you are pregnant or may be pregnant. What are the  risks? Generally, this is a safe procedure. However, problems may occur, including:  Bleeding.  Damage to your intestine.  Allergic reactions to medicines given during the procedure.  Infection. This is rare. What happens before the procedure? Eating and drinking restrictions Follow instructions from your health care provider about eating or drinking restrictions, which may include:  A few days before the procedure: ? Follow a low-fiber diet. ? Avoid nuts, seeds, dried fruit, raw fruits, and vegetables.  1-3 days before the procedure: ? Eat only gelatin dessert or ice pops. ? Drink only clear liquids, such as water, clear juice, clear broth or bouillon, black coffee or tea, or clear soft drinks or sports drinks. ? Avoid liquids that contain red or purple dye.  The day of the procedure: ? Do not eat solid foods. You may continue to drink clear liquids until up to 2 hours before the procedure. ? Do not eat or drink anything starting 2 hours before the procedure, or within the time period that your health care provider recommends. Bowel prep If you were prescribed a bowel prep to take by mouth (orally) to clean out your colon:  Take it as told by your health care provider. Starting the day before your procedure, you will need to drink a large amount of liquid medicine. The liquid will cause you to have many bowel movements of loose stool until your  stool becomes almost clear or light green.  If your skin or the opening between the buttocks (anus) gets irritated from diarrhea, you may relieve the irritation using: ? Wipes with medicine in them, such as adult wet wipes with aloe and vitamin E. ? A product to soothe skin, such as petroleum jelly.  If you vomit while drinking the bowel prep: ? Take a break for up to 60 minutes. ? Begin the bowel prep again. ? Call your health care provider if you keep vomiting or you cannot take the bowel prep without vomiting.  To clean out your  colon, you may also be given: ? Laxative medicines. These help you have a bowel movement. ? Instructions for enema use. An enema is liquid medicine injected into your rectum. Medicines Ask your health care provider about:  Changing or stopping your regular medicines or supplements. This is especially important if you are taking iron supplements, diabetes medicines, or blood thinners.  Taking medicines such as aspirin and ibuprofen. These medicines can thin your blood. Do not take these medicines unless your health care provider tells you to take them.  Taking over-the-counter medicines, vitamins, herbs, and supplements. General instructions  Ask your health care provider what steps will be taken to help prevent infection. These may include washing skin with a germ-killing soap.  Plan to have someone take you home from the hospital or clinic. What happens during the procedure?   An IV will be inserted into one of your veins.  You may be given one or more of the following: ? A medicine to help you relax (sedative). ? A medicine to numb the area (local anesthetic). ? A medicine to make you fall asleep (general anesthetic). This is rarely needed.  You will lie on your side with your knees bent.  The tube will: ? Have oil or gel put on it (be lubricated). ? Be inserted into your anus. ? Be gently eased through all parts of your large intestine.  Air will be sent into your colon to keep it open. This may cause some pressure or cramping.  Images will be taken with the camera and will appear on a screen.  A small tissue sample may be removed to be looked at under a microscope (biopsy). The tissue may be sent to a lab for testing if any signs of problems are found.  If small polyps are found, they may be removed and checked for cancer cells.  When the procedure is finished, the tube will be removed. The procedure may vary among health care providers and hospitals. What happens after  the procedure?  Your blood pressure, heart rate, breathing rate, and blood oxygen level will be monitored until you leave the hospital or clinic.  You may have a small amount of blood in your stool.  You may pass gas and have mild cramping or bloating in your abdomen. This is caused by the air that was used to open your colon during the exam.  Do not drive for 24 hours after the procedure.  It is up to you to get the results of your procedure. Ask your health care provider, or the department that is doing the procedure, when your results will be ready. Summary  A colonoscopy is a procedure to look at the entire large intestine.  Follow instructions from your health care provider about eating and drinking before the procedure.  If you were prescribed an oral bowel prep to clean out your colon, take it  as told by your health care provider.  During the colonoscopy, a flexible tube with a camera on its end is inserted into the anus and then passed into the other parts of the large intestine. This information is not intended to replace advice given to you by your health care provider. Make sure you discuss any questions you have with your health care provider. Document Revised: 08/16/2018 Document Reviewed: 08/16/2018 Elsevier Patient Education  [employer].

## 2019-04-15 NOTE — Progress Notes (Addendum)
Subjective:   Valerie Larson is a 64 y.o. female who presents for an Initial Medicare Annual Wellness Visit.  Review of Systems    No ROS.  Medicare Wellness Virtual Visit.  Visual/audio telehealth visit, UTA vital signs.   See social history for additional risk factors.    Cardiac Risk Factors include: advanced age (>58men, >56 women);hypertension     Objective:    Today's Vitals   04/15/19 0907  Weight: 162 lb (73.5 kg)   Body mass index is 28.7 kg/m.  Advanced Directives 04/15/2019  Does Patient Have a Medical Advance Directive? No  Would patient like information on creating a medical advance directive? No - Patient declined    Current Medications (verified) Outpatient Encounter Medications as of 04/15/2019  Medication Sig  . albuterol (VENTOLIN HFA) 108 (90 Base) MCG/ACT inhaler TAKE 2 PUFFS BY MOUTH EVERY 6 HOURS AS NEEDED FOR WHEEZE OR SHORTNESS OF BREATH  . amLODipine-atorvastatin (CADUET) 5-10 MG tablet Take 1 tablet by mouth daily.  Marland Kitchen azelastine (ASTELIN) 0.1 % nasal spray SMARTSIG:2 Spray(s) Both Nares Every 12 Hours PRN  . hydrochlorothiazide (HYDRODIURIL) 25 MG tablet Take 1 tablet (25 mg total) by mouth daily.  Marland Kitchen loratadine (CLARITIN) 10 MG tablet Take 10 mg by mouth daily.  . Multiple Vitamins-Minerals (PRESERVISION AREDS 2+MULTI VIT PO) Take 1 tablet by mouth.  Marland Kitchen omeprazole (PRILOSEC) 40 MG capsule SMARTSIG:1 Pill By Mouth Every Morning  . potassium chloride (KLOR-CON) 20 MEQ packet Take 20 mEq by mouth every other day.   No facility-administered encounter medications on file as of 04/15/2019.    Allergies (verified) Patient has no known allergies.   History: Past Medical History:  Diagnosis Date  . Chicken pox   . Emphysema of lung ([place])   . Hyperlipidemia   . Hypertension    Past Surgical History:  Procedure Laterality Date  . ABDOMINAL HYSTERECTOMY  2000   complete hysterectomy, no ovaries.   . APPENDECTOMY  1974  . GALLBLADDER SURGERY    .  OVARIAN CYST SURGERY  2004  . TONSILLECTOMY     Family History  Problem Relation Age of Onset  . Cancer Mother   . Hyperlipidemia Mother   . Hypertension Mother   . Ovarian cancer Mother 63  . COPD Father   . Diabetes Father   . Hyperlipidemia Father   . Hypertension Sister   . Hypertension Brother    Social History   Socioeconomic History  . Marital status: Married    Spouse name: Not on file  . Number of children: Not on file  . Years of education: Not on file  . Highest education level: Not on file  Occupational History  . Not on file  Tobacco Use  . Smoking status: Former Research scientist (life sciences)  . Smokeless tobacco: Never Used  Substance and Sexual Activity  . Alcohol use: Never  . Drug use: Never  . Sexual activity: Not on file  Other Topics Concern  . Not on file  Social History Narrative  . Not on file   Social Determinants of Health   Financial Resource Strain:   . Difficulty of Paying Living Expenses: Not on file  Food Insecurity:   . Worried About Charity fundraiser in the Last Year: Not on file  . Ran Out of Food in the Last Year: Not on file  Transportation Needs:   . Lack of Transportation (Medical): Not on file  . Lack of Transportation (Non-Medical): Not on file  Physical Activity:   .  Days of Exercise per Week: Not on file  . Minutes of Exercise per Session: Not on file  Stress:   . Feeling of Stress : Not on file  Social Connections:   . Frequency of Communication with Friends and Family: Not on file  . Frequency of Social Gatherings with Friends and Family: Not on file  . Attends Religious Services: Not on file  . Active Member of Clubs or Organizations: Not on file  . Attends Archivist Meetings: Not on file  . Marital Status: Not on file    Tobacco Counseling Counseling given: Not Answered   Clinical Intake:  Pre-visit preparation completed: Yes        Diabetes: No  How often do you need to have someone help you when you read  instructions, pamphlets, or other written materials from your doctor or pharmacy?: 1 - Never  Interpreter Needed?: No      Activities of Daily Living In your present state of health, do you have any difficulty performing the following activities: 04/15/2019 01/20/2019  Hearing? N N  Vision? N N  Difficulty concentrating or making decisions? N N  Walking or climbing stairs? N N  Dressing or bathing? N N  Doing errands, shopping? N N  Preparing Food and eating ? N -  Using the Toilet? N -  In the past six months, have you accidently leaked urine? N -  Do you have problems with loss of bowel control? N -  Managing your Medications? N -  Managing your Finances? N -  Housekeeping or managing your Housekeeping? N -  Some recent data might be hidden     Immunizations and Health Maintenance Immunization History  Administered Date(s) Administered  . Fluad Quad(high Dose 65+) 02/12/2019  . PFIZER SARS-COV-2 Vaccination 03/17/2019, 04/05/2019  . Pneumococcal Polysaccharide-23 02/11/2007   Health Maintenance Due  Topic Date Due  . COLONOSCOPY  03/10/1971  . TETANUS/TDAP  03/09/1972  . DEXA SCAN  03/09/2018  . PNA vac Low Risk Adult (1 of 2 - PCV13) 03/09/2018    Patient Care Team: Burnard Hawthorne, FNP as PCP - General (Family Medicine)  Indicate any recent Medical Services you may have received from other than [facility] providers in the past year (date may be approximate).     Assessment:   This is a routine wellness examination for [name].  Nurse connected with patient 04/15/19 at  9:00 AM EST by a telephone enabled telemedicine application and verified that I am speaking with the correct person using two identifiers. Patient stated full name and DOB. Patient gave permission to continue with virtual visit. Patient's location was at home and Nurse's location was at [place] office.   Patient is alert and oriented x3. Patient denies difficulty focusing or concentrating. Patient  likes to read, play games shows and complete online puzzles for brain stimulation.   Health Maintenance Due: -Prevnar vaccine- discussed; to be completed with doctor in visit or local pharmacy.  -Dexa Scan- plans to schedule -Mammogram- plans to schedule See completed HM at the end of note.   Eye: Visual acuity not assessed. Virtual visit. Followed by their ophthalmologist.  Dental: Visits every 6 months.    Hearing: Demonstrates normal hearing during visit.  Safety:  Patient feels safe at home- yes Patient does have smoke detectors at home- yes Patient does wear sunscreen or protective clothing when in direct sunlight - yes Patient does wear seat belt when in a moving vehicle - yes Patient drives-  yes Adequate lighting in walkways free from debris- yes Grab bars and handrails used as appropriate- yes Ambulates with an assistive device- no  Social: Alcohol intake - no       Smoking history- former   Smokers in home? none Illicit drug use? none  Medication: Taking as directed and without issues.  Self managed - yes   Covid-19: Precautions and sickness symptoms discussed. Wears mask, social distancing, hand hygiene as appropriate.   Activities of Daily Living Patient denies needing assistance with: household chores, feeding themselves, getting from bed to chair, getting to the toilet, bathing/showering, dressing, managing money, or preparing meals.   Discussed the importance of a healthy diet, water intake and the benefits of aerobic exercise.   Physical activity- active around the home, no routine.   Diet:  Regular Water: fair intake Caffeine: 1 cup of coffee, 1 pepsi   Other Providers Patient Care Team: Burnard Hawthorne, FNP as PCP - General (Family Medicine)  Hearing/Vision screen  Hearing Screening   125Hz  250Hz  500Hz  1000Hz  2000Hz  3000Hz  4000Hz  6000Hz  8000Hz   Right ear:           Left ear:           Comments: Patient is able to hear conversational  tones without difficulty.  No issues reported.  Vision Screening Comments: Wears corrective lenses Visual acuity not assessed, virtual visit.  They have seen their ophthalmologist in the last 12 months.     Dietary issues and exercise activities discussed: Current Exercise Habits: Home exercise routine, Intensity: Mild  Goals      Patient Stated   . I want to walk more for exercise and lose weight (pt-stated)      Depression Screen PHQ 2/9 Scores 04/15/2019 01/20/2019  PHQ - 2 Score 0 0    Fall Risk Fall Risk  04/15/2019 01/20/2019  Falls in the past year? 0 0  Follow up Falls evaluation completed Falls evaluation completed    Timed Get Up and Go Performed no, virtual visit  Cognitive Function: MMSE - Mini Mental State Exam 04/15/2019  Not completed: Unable to complete        Screening Tests Health Maintenance  Topic Date Due  . COLONOSCOPY  03/10/1971  . TETANUS/TDAP  03/09/1972  . DEXA SCAN  03/09/2018  . PNA vac Low Risk Adult (1 of 2 - PCV13) 03/09/2018  . MAMMOGRAM  03/15/2020  . INFLUENZA VACCINE  Completed  . Hepatitis C Screening  Completed      Plan:    Keep all routine maintenance appointments.   Follow up 04/23/19 @ 9:00  Medicare Attestation I have personally reviewed: The patient's medical and social history Their use of alcohol, tobacco or illicit drugs Their current medications and supplements The patient's functional ability including ADLs,fall risks, home safety risks, cognitive, and hearing and visual impairment Diet and physical activities Evidence for depression   I have reviewed and discussed with patient certain preventive protocols, quality metrics, and best practice recommendations. Arby Barrette, Jeremiyah Cullens L, LPN   [id number]    Agree with plan. Mable Paris, NP

## 2019-04-17 ENCOUNTER — Other Ambulatory Visit: Payer: Self-pay

## 2019-04-17 ENCOUNTER — Telehealth: Payer: Self-pay

## 2019-04-17 DIAGNOSIS — Z1211 Encounter for screening for malignant neoplasm of colon: Secondary | ICD-10-CM

## 2019-04-17 NOTE — Telephone Encounter (Signed)
Gastroenterology Pre-Procedure Review  Request Date: Thursday 05/22/19 Requesting Physician: Dr. Vicente Males  PATIENT REVIEW QUESTIONS: The patient responded to the following health history questions as indicated:    1. Are you having any GI issues? no 2. Do you have a personal history of Polyps? yes (10 years ago) 3. Do you have a family history of Colon Cancer or Polyps? no 4. Diabetes Mellitus? no 5. Joint replacements in the past 12 months?no 6. Major health problems in the past 3 months?no 7. Any artificial heart valves, MVP, or defibrillator?no    MEDICATIONS & ALLERGIES:    Patient reports the following regarding taking any anticoagulation/antiplatelet therapy:   Plavix, Coumadin, Eliquis, Xarelto, Lovenox, Pradaxa, Brilinta, or Effient? no Aspirin? no  Patient confirms/reports the following medications:  Current Outpatient Medications  Medication Sig Dispense Refill  . albuterol (VENTOLIN HFA) 108 (90 Base) MCG/ACT inhaler TAKE 2 PUFFS BY MOUTH EVERY 6 HOURS AS NEEDED FOR WHEEZE OR SHORTNESS OF BREATH 18 g 3  . amLODipine-atorvastatin (CADUET) 5-10 MG tablet Take 1 tablet by mouth daily. 90 tablet 1  . azelastine (ASTELIN) 0.1 % nasal spray SMARTSIG:2 Spray(s) Both Nares Every 12 Hours PRN    . hydrochlorothiazide (HYDRODIURIL) 25 MG tablet Take 1 tablet (25 mg total) by mouth daily. 90 tablet 1  . loratadine (CLARITIN) 10 MG tablet Take 10 mg by mouth daily.    . Multiple Vitamins-Minerals (PRESERVISION AREDS 2+MULTI VIT PO) Take 1 tablet by mouth.    Marland Kitchen omeprazole (PRILOSEC) 40 MG capsule SMARTSIG:1 Pill By Mouth Every Morning    . potassium chloride (KLOR-CON) 20 MEQ packet Take 20 mEq by mouth every other day. 30 packet 5   No current facility-administered medications for this visit.    Patient confirms/reports the following allergies:  No Known Allergies  No orders of the defined types were placed in this encounter.   AUTHORIZATION INFORMATION Primary  Insurance: 1D#: Group #:  Secondary Insurance: 1D#: Group #:  SCHEDULE INFORMATION: Date: Thursday 05/22/19 Time: Location:[facility]

## 2019-04-23 ENCOUNTER — Other Ambulatory Visit: Payer: Self-pay

## 2019-04-23 ENCOUNTER — Encounter: Payer: Self-pay | Admitting: Family

## 2019-04-23 ENCOUNTER — Telehealth (INDEPENDENT_AMBULATORY_CARE_PROVIDER_SITE_OTHER): Payer: Medicare Other | Admitting: Family

## 2019-04-23 VITALS — BP 126/77 | Ht 63.0 in | Wt 167.0 lb

## 2019-04-23 DIAGNOSIS — Z780 Asymptomatic menopausal state: Secondary | ICD-10-CM | POA: Diagnosis not present

## 2019-04-23 DIAGNOSIS — Z13820 Encounter for screening for osteoporosis: Secondary | ICD-10-CM | POA: Diagnosis not present

## 2019-04-23 DIAGNOSIS — Z1231 Encounter for screening mammogram for malignant neoplasm of breast: Secondary | ICD-10-CM | POA: Diagnosis not present

## 2019-04-23 DIAGNOSIS — I10 Essential (primary) hypertension: Secondary | ICD-10-CM | POA: Diagnosis not present

## 2019-04-23 NOTE — Progress Notes (Signed)
Virtual Visit via Video Note  I connected with@  on 04/23/19 at  9:00 AM EDT by a video enabled telemedicine application and verified that I am speaking with the correct person using two identifiers.  Location patient: home Location provider:work  Persons participating in the virtual visit: patient, provider  I discussed the limitations of evaluation and management by telemedicine and the availability of in person appointments. The patient expressed understanding and agreed to proceed.   HPI: Feels well today.  Follow-up blood pressure. Blood pressure at home 126/77 today. Denies exertional chest pain or pressure, numbness or tingling radiating to left arm or jaw, palpitations, dizziness, frequent headaches, changes in vision, or shortness of breath.   She has been taking vitamin D.  Colonoscopy scheduled  MM due.   ROS: See pertinent positives and negatives per HPI.  Past Medical History:  Diagnosis Date  . Chicken pox   . Emphysema of lung ([place])   . Hyperlipidemia   . Hypertension     Past Surgical History:  Procedure Laterality Date  . ABDOMINAL HYSTERECTOMY  2000   complete hysterectomy, no ovaries.   . APPENDECTOMY  1974  . GALLBLADDER SURGERY    . OVARIAN CYST SURGERY  2004  . TONSILLECTOMY      Family History  Problem Relation Age of Onset  . Cancer Mother   . Hyperlipidemia Mother   . Hypertension Mother   . Ovarian cancer Mother 70  . COPD Father   . Diabetes Father   . Hyperlipidemia Father   . Hypertension Sister   . Hypertension Brother        Current Outpatient Medications:  .  albuterol (VENTOLIN HFA) 108 (90 Base) MCG/ACT inhaler, TAKE 2 PUFFS BY MOUTH EVERY 6 HOURS AS NEEDED FOR WHEEZE OR SHORTNESS OF BREATH, Disp: 18 g, Rfl: 3 .  amLODipine-atorvastatin (CADUET) 5-10 MG tablet, Take 1 tablet by mouth daily., Disp: 90 tablet, Rfl: 1 .  azelastine (ASTELIN) 0.1 % nasal spray, SMARTSIG:2 Spray(s) Both Nares Every 12 Hours PRN, Disp: , Rfl:  .   cholecalciferol (VITAMIN D3) 25 MCG (1000 UNIT) tablet, Take 1,000 Units by mouth daily., Disp: , Rfl:  .  hydrochlorothiazide (HYDRODIURIL) 25 MG tablet, Take 1 tablet (25 mg total) by mouth daily., Disp: 90 tablet, Rfl: 1 .  loratadine (CLARITIN) 10 MG tablet, Take 10 mg by mouth daily., Disp: , Rfl:  .  Multiple Vitamins-Minerals (PRESERVISION AREDS 2+MULTI VIT PO), Take 1 tablet by mouth., Disp: , Rfl:  .  omeprazole (PRILOSEC) 40 MG capsule, SMARTSIG:1 Pill By Mouth Every Morning, Disp: , Rfl:  .  potassium chloride (KLOR-CON) 20 MEQ packet, Take 20 mEq by mouth every other day., Disp: 30 packet, Rfl: 5  EXAM:  VITALS per patient if applicable: BP Readings from Last 3 Encounters:  04/23/19 126/77  03/05/18 132/90  12/18/17 128/74   GENERAL: alert, oriented, appears well and in no acute distress  HEENT: atraumatic, conjunttiva clear, no obvious abnormalities on inspection of external nose and ears  NECK: normal movements of the head and neck  LUNGS: on inspection no signs of respiratory distress, breathing rate appears normal, no obvious gross SOB, gasping or wheezing  CV: no obvious cyanosis  MS: moves all visible extremities without noticeable abnormality  PSYCH/NEURO: pleasant and cooperative, no obvious depression or anxiety, speech and thought processing grossly intact  ASSESSMENT AND PLAN:  Discussed the following assessment and plan:  Screening for osteoporosis - Plan: DG Bone Density  Asymptomatic menopausal state -  Plan: DG Bone Density  Essential hypertension  Encounter for screening mammogram for malignant neoplasm of breast Problem List Items Addressed This Visit      Cardiovascular and Mediastinum   Essential hypertension    Stable, controlled.  Continue amlodipine ,hydrochlorothiazide        Other   Screening for breast cancer    Patient will schedule mammogram along with bone density  a month after her last Covid vaccine.       Other Visit  Diagnoses    Screening for osteoporosis    -  Primary   Relevant Orders   DG Bone Density   Asymptomatic menopausal state       Relevant Orders   DG Bone Density      -we discussed possible serious and likely etiologies, options for evaluation and workup, limitations of telemedicine visit vs in person visit, treatment, treatment risks and precautions. Pt prefers to treat via telemedicine empirically rather then risking or undertaking an in person visit at this moment. Patient agrees to seek prompt in person care if worsening, new symptoms arise, or if is not improving with treatment.   I discussed the assessment and treatment plan with the patient. The patient was provided an opportunity to ask questions and all were answered. The patient agreed with the plan and demonstrated an understanding of the instructions.   The patient was advised to call back or seek an in-person evaluation if the symptoms worsen or if the condition fails to improve as anticipated.   Mable Paris, FNP

## 2019-04-23 NOTE — Assessment & Plan Note (Signed)
Stable, controlled.  Continue amlodipine ,hydrochlorothiazide

## 2019-04-23 NOTE — Assessment & Plan Note (Addendum)
Patient will schedule mammogram along with bone density  a month after her last Covid vaccine.

## 2019-05-19 ENCOUNTER — Other Ambulatory Visit: Payer: Self-pay

## 2019-05-19 ENCOUNTER — Telehealth: Payer: Self-pay

## 2019-05-19 MED ORDER — PEG 3350-KCL-NA BICARB-NACL 420 G PO SOLR: 4000.0000 mL | Freq: Once | ORAL | 0 refills | Status: AC

## 2019-05-19 NOTE — Telephone Encounter (Signed)
Patient insurance will not accept prep prescription. Ins will approve CLENPIQ. Please send new prescription to [facility] on [address] [place]

## 2019-05-19 NOTE — Telephone Encounter (Signed)
ClenPiq rx has been called in to the pharmacy.  LVM on physicians line.  Thanks,  [place], [place]

## 2019-05-20 ENCOUNTER — Other Ambulatory Visit: Payer: Self-pay

## 2019-05-20 ENCOUNTER — Other Ambulatory Visit
Admission: RE | Admit: 2019-05-20 | Discharge: 2019-05-20 | Disposition: A | Discharge status: Home / Self Care | Payer: Medicare Other | Source: Ambulatory Visit | Attending: Gastroenterology | Admitting: Gastroenterology

## 2019-05-20 DIAGNOSIS — Z01812 Encounter for preprocedural laboratory examination: Secondary | ICD-10-CM | POA: Diagnosis not present

## 2019-05-20 DIAGNOSIS — Z20822 Contact with and (suspected) exposure to covid-19: Secondary | ICD-10-CM | POA: Insufficient documentation

## 2019-05-20 LAB — SARS CORONAVIRUS 2 (TAT 6-24 HRS): SARS Coronavirus 2: NEGATIVE

## 2019-05-21 ENCOUNTER — Encounter: Payer: Self-pay | Admitting: Gastroenterology

## 2019-05-22 ENCOUNTER — Ambulatory Visit: Payer: Medicare Other | Admitting: Certified Registered"

## 2019-05-22 ENCOUNTER — Ambulatory Visit
Admission: RE | Admit: 2019-05-22 | Discharge: 2019-05-22 | Disposition: A | Discharge status: Home / Self Care | Payer: Medicare Other | Attending: Gastroenterology | Admitting: Gastroenterology

## 2019-05-22 ENCOUNTER — Other Ambulatory Visit: Payer: Self-pay

## 2019-05-22 ENCOUNTER — Encounter
Admission: RE | Disposition: A | Discharge status: Home / Self Care | Payer: Self-pay | Source: Home / Self Care | Attending: Gastroenterology

## 2019-05-22 ENCOUNTER — Encounter: Payer: Self-pay | Admitting: Gastroenterology

## 2019-05-22 DIAGNOSIS — I10 Essential (primary) hypertension: Secondary | ICD-10-CM | POA: Insufficient documentation

## 2019-05-22 DIAGNOSIS — Z86010 Personal history of colonic polyps: Secondary | ICD-10-CM | POA: Diagnosis not present

## 2019-05-22 DIAGNOSIS — K640 First degree hemorrhoids: Secondary | ICD-10-CM | POA: Insufficient documentation

## 2019-05-22 DIAGNOSIS — Z79899 Other long term (current) drug therapy: Secondary | ICD-10-CM | POA: Diagnosis not present

## 2019-05-22 DIAGNOSIS — F17200 Nicotine dependence, unspecified, uncomplicated: Secondary | ICD-10-CM | POA: Diagnosis not present

## 2019-05-22 DIAGNOSIS — Z87891 Personal history of nicotine dependence: Secondary | ICD-10-CM | POA: Insufficient documentation

## 2019-05-22 DIAGNOSIS — K635 Polyp of colon: Secondary | ICD-10-CM | POA: Diagnosis not present

## 2019-05-22 DIAGNOSIS — Z1211 Encounter for screening for malignant neoplasm of colon: Secondary | ICD-10-CM | POA: Insufficient documentation

## 2019-05-22 DIAGNOSIS — D122 Benign neoplasm of ascending colon: Secondary | ICD-10-CM | POA: Diagnosis not present

## 2019-05-22 DIAGNOSIS — J439 Emphysema, unspecified: Secondary | ICD-10-CM | POA: Insufficient documentation

## 2019-05-22 DIAGNOSIS — D120 Benign neoplasm of cecum: Secondary | ICD-10-CM | POA: Insufficient documentation

## 2019-05-22 DIAGNOSIS — E785 Hyperlipidemia, unspecified: Secondary | ICD-10-CM | POA: Insufficient documentation

## 2019-05-22 HISTORY — PX: COLONOSCOPY WITH PROPOFOL: SHX5780

## 2019-05-22 SURGERY — COLONOSCOPY WITH PROPOFOL
Anesthesia: General

## 2019-05-22 MED ORDER — SODIUM CHLORIDE 0.9 % IV SOLN: INTRAVENOUS | Status: DC | PRN

## 2019-05-22 MED ORDER — SODIUM CHLORIDE 0.9 % IV SOLN: INTRAVENOUS | Status: DC

## 2019-05-22 MED ORDER — LIDOCAINE HCL (CARDIAC) PF 100 MG/5ML IV SOSY
PREFILLED_SYRINGE | INTRAVENOUS | Status: DC | PRN
  Administered 2019-05-22: 100 mg via INTRATRACHEAL

## 2019-05-22 MED ORDER — EPHEDRINE SULFATE 50 MG/ML IJ SOLN
INTRAMUSCULAR | Status: DC | PRN
  Administered 2019-05-22 (×4): 5 mg via INTRAVENOUS

## 2019-05-22 MED ORDER — PROPOFOL 10 MG/ML IV BOLUS
INTRAVENOUS | Status: DC | PRN
  Administered 2019-05-22: 50 mg via INTRAVENOUS
  Administered 2019-05-22: 10 mg via INTRAVENOUS

## 2019-05-22 MED ORDER — PROPOFOL 500 MG/50ML IV EMUL
INTRAVENOUS | Status: DC | PRN
  Administered 2019-05-22: 155 ug/kg/min via INTRAVENOUS

## 2019-05-22 MED ORDER — PHENYLEPHRINE HCL (PRESSORS) 10 MG/ML IV SOLN: INTRAVENOUS | Status: DC | PRN

## 2019-05-22 NOTE — Transfer of Care (Signed)
Immediate Anesthesia Transfer of Care Note  Patient: [place]  Procedure(s) Performed: COLONOSCOPY WITH PROPOFOL (N/A )  Patient Location: Endoscopy Unit  Anesthesia Type:General  Level of Consciousness: drowsy, patient cooperative and responds to stimulation  Airway & Oxygen Therapy: Patient Spontanous Breathing and Patient connected to face mask oxygen  Post-op Assessment: Report given to RN and Post -op Vital signs reviewed and stable  Post vital signs: Reviewed and stable  Last Vitals:  Vitals Value Taken Time  BP 90/57 05/22/19 0858  Temp 36.3 C 05/22/19 0857  Pulse 108 05/22/19 0858  Resp 18 05/22/19 0858  SpO2 97 % 05/22/19 0858  Vitals shown include unvalidated device data.  Last Pain:  Vitals:   05/22/19 0857  TempSrc: Temporal         Complications: No apparent anesthesia complications

## 2019-05-22 NOTE — Op Note (Signed)
[facility]enter[facility] Patient Name: [facility] Procedure Date: 05/22/2019 8:17 AM MRN: [id number] Account #: [id number] Date of Birth: [birth year] Admit Type: Outpatient Age: 64 Room: [facility] ENDO ROOM 1 Gender: Female Note Status: Finalized Procedure:             Colonoscopy Indications:           High risk colon cancer surveillance: Personal history                         of colonic polyps Providers:             Jonathon Bellows MD, MD Medicines:             Monitored Anesthesia Care Complications:         No immediate complications. Procedure:             Pre-Anesthesia Assessment:                        - Prior to the procedure, a History and Physical was                         performed, and patient medications, allergies and                         sensitivities were reviewed. The patient's tolerance                         of previous anesthesia was reviewed.                        - The risks and benefits of the procedure and the                         sedation options and risks were discussed with the                         patient. All questions were answered and informed                         consent was obtained.                        - ASA Grade Assessment: II - A patient with mild                         systemic disease.                        After obtaining informed consent, the colonoscope was                         passed under direct vision. Throughout the procedure,                         the patient's blood pressure, pulse, and oxygen                         saturations were monitored continuously. The  Colonoscope was introduced through the anus and                         advanced to the the cecum, identified by the                         appendiceal orifice. The colonoscopy was performed                         with ease. The patient tolerated the procedure well.                         The quality of the  bowel preparation was excellent. Findings:      The perianal and digital rectal examinations were normal.      Non-bleeding internal hemorrhoids were found during retroflexion. The       hemorrhoids were large and Grade I (internal hemorrhoids that do not       prolapse).      Two sessile polyps were found in the sigmoid colon and cecum. The polyps       were 4 to 6 mm in size. These polyps were removed with a cold snare.       Resection and retrieval were complete.      Three sessile polyps were found in the ascending colon. The polyps were       4 to 6 mm in size. These polyps were removed with a cold snare.       Resection and retrieval were complete.      The exam was otherwise without abnormality on direct and retroflexion       views. Impression:            - Non-bleeding internal hemorrhoids.                        - Two 4 to 6 mm polyps in the sigmoid colon and in the                         cecum, removed with a cold snare. Resected and                         retrieved.                        - Three 4 to 6 mm polyps in the ascending colon,                         removed with a cold snare. Resected and retrieved.                        - The examination was otherwise normal on direct and                         retroflexion views. Recommendation:        - Discharge patient to home (with escort).                        - Resume previous diet.                        -  Continue present medications.                        - Await pathology results.                        - Repeat colonoscopy for surveillance based on                         pathology results. Procedure Code(s):     --- Professional ---                        [phone], Colonoscopy, flexible; with removal of                         tumor(s), polyp(s), or other lesion(s) by snare                         technique Diagnosis Code(s):     --- Professional ---                        Z86.010, Personal history of colonic  polyps                        K63.5, Polyp of colon                        K64.0, First degree hemorrhoids CPT copyright 2019 American Medical Association. All rights reserved. The codes documented in this report are preliminary and upon coder review may  be revised to meet current compliance requirements. Jonathon Bellows, MD Jonathon Bellows MD, MD 05/22/2019 8:57:24 AM This report has been signed electronically. Number of Addenda: 0 Note Initiated On: 05/22/2019 8:17 AM Scope Withdrawal Time: 0 hours 11 minutes 40 seconds  Total Procedure Duration: 0 hours 18 minutes 18 seconds  Estimated Blood Loss:  Estimated blood loss: none.      [facility]

## 2019-05-22 NOTE — Anesthesia Postprocedure Evaluation (Signed)
Anesthesia Post Note  Patient: Valerie Larson  Procedure(s) Performed: COLONOSCOPY WITH PROPOFOL (N/A )  Patient location during evaluation: Endoscopy Anesthesia Type: General Level of consciousness: awake and alert and oriented Pain management: pain level controlled Vital Signs Assessment: post-procedure vital signs reviewed and stable Respiratory status: spontaneous breathing Cardiovascular status: blood pressure returned to baseline Anesthetic complications: no     Last Vitals:  Vitals:   05/22/19 0815 05/22/19 0857  BP: (!) 140/94   Pulse: 71   Resp: 18   Temp: (!) 35.7 C (!) 36.3 C  SpO2: 99%     Last Pain:  Vitals:   05/22/19 0927  TempSrc:   PainSc: 0-No pain                 Rayford Williamsen

## 2019-05-22 NOTE — H&P (Signed)
Jonathon Bellows, MD [address], [place], [place], [place], [address] [address], [place], [place], [place], [address] Phone: [phone]  Fax: [phone]  Primary Care Physician:  Burnard Hawthorne, FNP   Pre-Procedure History & Physical: HPI:  [place] is a 64 y.o. female is here for an colonoscopy.   Past Medical History:  Diagnosis Date  . Chicken pox   . Emphysema of lung ([place])   . Hyperlipidemia   . Hypertension     Past Surgical History:  Procedure Laterality Date  . ABDOMINAL HYSTERECTOMY  2000   complete hysterectomy, no ovaries.   . APPENDECTOMY  1974  . CHOLECYSTECTOMY    . DIAGNOSTIC LAPAROSCOPY    . GALLBLADDER SURGERY    . OVARIAN CYST SURGERY  2004  . TONSILLECTOMY    . TONSILLECTOMY      Prior to Admission medications   Medication Sig Start Date End Date Taking? Authorizing Provider  amLODipine-atorvastatin (CADUET) 5-10 MG tablet Take 1 tablet by mouth daily. 01/20/19  Yes Burnard Hawthorne, FNP  azelastine (ASTELIN) 0.1 % nasal spray SMARTSIG:2 Spray(s) Both Nares Every 12 Hours PRN 01/09/19  Yes [provider]  hydrochlorothiazide (HYDRODIURIL) 25 MG tablet Take 1 tablet (25 mg total) by mouth daily. 01/20/19  Yes Arnett, Yvetta Coder, FNP  loratadine (CLARITIN) 10 MG tablet Take 10 mg by mouth daily.   Yes [provider]  albuterol (VENTOLIN HFA) 108 (90 Base) MCG/ACT inhaler TAKE 2 PUFFS BY MOUTH EVERY 6 HOURS AS NEEDED FOR WHEEZE OR SHORTNESS OF BREATH 01/20/19   Burnard Hawthorne, FNP  cholecalciferol (VITAMIN D3) 25 MCG (1000 UNIT) tablet Take 1,000 Units by mouth daily.    [provider]  Multiple Vitamins-Minerals (PRESERVISION AREDS 2+MULTI VIT PO) Take 1 tablet by mouth.    [provider]  omeprazole (PRILOSEC) 40 MG capsule SMARTSIG:1 Pill By Mouth Every Morning 12/30/18   [provider]  potassium chloride (KLOR-CON) 20 MEQ packet Take 20 mEq by mouth every other day. 12/18/17    Jodelle Green, FNP    Allergies as of 04/18/2019  . (No Known Allergies)    Family History  Problem Relation Age of Onset  . Cancer Mother   . Hyperlipidemia Mother   . Hypertension Mother   . Ovarian cancer Mother 38  . COPD Father   . Diabetes Father   . Hyperlipidemia Father   . Hypertension Sister   . Hypertension Brother     Social History   Socioeconomic History  . Marital status: Married    Spouse name: Not on file  . Number of children: Not on file  . Years of education: Not on file  . Highest education level: Not on file  Occupational History  . Not on file  Tobacco Use  . Smoking status: Current Every Day Smoker  . Smokeless tobacco: Never Used  Substance and Sexual Activity  . Alcohol use: Never  . Drug use: Never  . Sexual activity: Not on file  Other Topics Concern  . Not on file  Social History Narrative   Grandson 3 , diagnosed with leukemia at 2. He is doing well, but she still worries about him.    Social Determinants of Health   Financial Resource Strain:   . Difficulty of Paying Living Expenses:   Food Insecurity:   . Worried About Charity fundraiser in the Last Year:   . [place] in the Last Year:  Transportation Needs:   . Film/video editor (Medical):   Marland Kitchen Lack of Transportation (Non-Medical):   Physical Activity:   . Days of Exercise per Week:   . Minutes of Exercise per Session:   Stress:   . Feeling of Stress :   Social Connections:   . Frequency of Communication with Friends and Family:   . Frequency of Social Gatherings with Friends and Family:   . Attends Religious Services:   . Active Member of Clubs or Organizations:   . Attends Archivist Meetings:   Marland Kitchen Marital Status:   Intimate Partner Violence:   . Fear of Current or Ex-Partner:   . Emotionally Abused:   Marland Kitchen Physically Abused:   . Sexually Abused:     Review of Systems: See HPI, otherwise negative ROS  Physical Exam: BP (!) 140/94   Pulse  71   Temp (!) 96.2 F (35.7 C) (Temporal)   Resp 18   Ht 5\' 3"  (1.6 m)   Wt 77.1 kg   SpO2 99%   BMI 30.11 kg/m  General:   Alert,  pleasant and cooperative in NAD Head:  Normocephalic and atraumatic. Neck:  Supple; no masses or thyromegaly. Lungs:  Clear throughout to auscultation, normal respiratory effort.    Heart:  +S1, +S2, Regular rate and rhythm, No edema. Abdomen:  Soft, nontender and nondistended. Normal bowel sounds, without guarding, and without rebound.   Neurologic:  Alert and  oriented x4;  grossly normal neurologically.  Impression/Plan: [place] is here for an colonoscopy to be performed for surveillance due to prior history of colon polyps   Risks, benefits, limitations, and alternatives regarding  colonoscopy have been reviewed with the patient.  Questions have been answered.  All parties agreeable.   Jonathon Bellows, MD  05/22/2019, 8:24 AM

## 2019-05-22 NOTE — Anesthesia Preprocedure Evaluation (Signed)
Anesthesia Evaluation  Patient identified by MRN, date of birth, ID band Patient awake    Reviewed: Allergy & Precautions, NPO status , Patient's Chart, lab work & pertinent test results  Airway Mallampati: III       Dental   Pulmonary COPD, former smoker,           Cardiovascular hypertension,      Neuro/Psych negative neurological ROS  negative psych ROS   GI/Hepatic Neg liver ROS,   Endo/Other  negative endocrine ROS  Renal/GU negative Renal ROS  negative genitourinary   Musculoskeletal negative musculoskeletal ROS (+)   Abdominal   Peds negative pediatric ROS (+)  Hematology negative hematology ROS (+)   Anesthesia Other Findings Past Medical History: No date: Chicken pox No date: Emphysema of lung ([facility]) No date: Hyperlipidemia No date: Hypertension  Reproductive/Obstetrics                             Anesthesia Physical Anesthesia Plan  ASA: III  Anesthesia Plan: General   Post-op Pain Management:    Induction: Intravenous  PONV Risk Score and Plan: Propofol infusion  Airway Management Planned: Nasal Cannula  Additional Equipment:   Intra-op Plan:   Post-operative Plan:   Informed Consent: I have reviewed the patients History and Physical, chart, labs and discussed the procedure including the risks, benefits and alternatives for the proposed anesthesia with the patient or authorized representative who has indicated his/her understanding and acceptance.     Dental advisory given  Plan Discussed with: CRNA and Surgeon  Anesthesia Plan Comments:         Anesthesia Quick Evaluation

## 2019-05-23 ENCOUNTER — Encounter: Payer: Self-pay | Admitting: *Deleted

## 2019-05-23 ENCOUNTER — Encounter: Payer: Self-pay | Admitting: Gastroenterology

## 2019-05-23 LAB — SURGICAL PATHOLOGY

## 2019-05-27 ENCOUNTER — Encounter: Payer: Self-pay | Admitting: Gastroenterology

## 2019-05-28 ENCOUNTER — Other Ambulatory Visit: Payer: Self-pay | Admitting: Family

## 2019-05-28 DIAGNOSIS — J069 Acute upper respiratory infection, unspecified: Secondary | ICD-10-CM

## 2019-06-10 ENCOUNTER — Encounter: Payer: Self-pay | Admitting: Family

## 2019-07-16 ENCOUNTER — Encounter: Payer: Self-pay | Admitting: Family

## 2019-07-18 ENCOUNTER — Other Ambulatory Visit: Payer: Self-pay | Admitting: Family

## 2019-09-12 ENCOUNTER — Other Ambulatory Visit: Payer: Self-pay | Admitting: Family

## 2019-09-12 DIAGNOSIS — Z1231 Encounter for screening mammogram for malignant neoplasm of breast: Secondary | ICD-10-CM

## 2019-10-03 ENCOUNTER — Other Ambulatory Visit: Payer: Self-pay

## 2019-10-03 ENCOUNTER — Ambulatory Visit
Admission: RE | Admit: 2019-10-03 | Discharge: 2019-10-03 | Disposition: A | Discharge status: Home / Self Care | Payer: Medicare Other | Source: Ambulatory Visit | Attending: Family | Admitting: Family

## 2019-10-03 DIAGNOSIS — Z1231 Encounter for screening mammogram for malignant neoplasm of breast: Secondary | ICD-10-CM | POA: Diagnosis not present

## 2019-10-29 ENCOUNTER — Telehealth: Payer: Medicare Other | Admitting: Family

## 2019-11-28 ENCOUNTER — Telehealth (INDEPENDENT_AMBULATORY_CARE_PROVIDER_SITE_OTHER): Payer: Medicare Other | Admitting: Family

## 2019-11-28 ENCOUNTER — Encounter: Payer: Self-pay | Admitting: Family

## 2019-11-28 VITALS — BP 108/73 | Ht 63.0 in | Wt 165.0 lb

## 2019-11-28 DIAGNOSIS — E559 Vitamin D deficiency, unspecified: Secondary | ICD-10-CM | POA: Diagnosis not present

## 2019-11-28 DIAGNOSIS — Z780 Asymptomatic menopausal state: Secondary | ICD-10-CM

## 2019-11-28 DIAGNOSIS — I10 Essential (primary) hypertension: Secondary | ICD-10-CM

## 2019-11-28 DIAGNOSIS — Z13820 Encounter for screening for osteoporosis: Secondary | ICD-10-CM | POA: Diagnosis not present

## 2019-11-28 DIAGNOSIS — E785 Hyperlipidemia, unspecified: Secondary | ICD-10-CM | POA: Diagnosis not present

## 2019-11-28 NOTE — Progress Notes (Signed)
Virtual Visit via Video Note  I connected with@  on 11/28/19 at  4:00 PM EDT by a video enabled telemedicine application and verified that I am speaking with the correct person using two identifiers.  Location patient: home Location provider:work  Persons participating in the virtual visit: patient, provider  I discussed the limitations of evaluation and management by telemedicine and the availability of in person appointments. The patient expressed understanding and agreed to proceed.   HPI: Medication follow up  Feels well today No complaints.  HTN- compliant with amlodipine, hctz, and potassium chloride. No cp, sob  HLD- compliant with atorvastatin  Compliant with vitamin D  H/o prediabetes  Mammogram UTD  Due DEXA  Due prevnar 13 , influenza vaccine   ROS: See pertinent positives and negatives per HPI.    EXAM:  VITALS per patient if applicable: BP 431/54   Ht 5\' 3"  (1.6 m)   Wt 165 lb (74.8 kg)   BMI 29.23 kg/m  BP Readings from Last 3 Encounters:  11/28/19 108/73  05/22/19 (!) 140/94  04/23/19 126/77   Wt Readings from Last 3 Encounters:  11/28/19 165 lb (74.8 kg)  05/22/19 170 lb (77.1 kg)  04/23/19 167 lb (75.8 kg)    GENERAL: alert, oriented, appears well and in no acute distress  HEENT: atraumatic, conjunttiva clear, no obvious abnormalities on inspection of external nose and ears  NECK: normal movements of the head and neck  LUNGS: on inspection no signs of respiratory distress, breathing rate appears normal, no obvious gross SOB, gasping or wheezing  CV: no obvious cyanosis  MS: moves all visible extremities without noticeable abnormality  PSYCH/NEURO: pleasant and cooperative, no obvious depression or anxiety, speech and thought processing grossly intact  ASSESSMENT AND PLAN:  Discussed the following assessment and plan:  Problem List Items Addressed This Visit      Cardiovascular and Mediastinum   Essential hypertension     Very well controlled. Continue amlodipine 5mg  and hctz 25mg  qd with potassium chloride 22meq qod      Relevant Orders   Comprehensive metabolic panel     Other   Hyperlipidemia    Controlled. Continue lipitor 10mg        Other Visit Diagnoses    Screening for osteoporosis    -  Primary   Relevant Orders   DG Bone Density   VITAMIN D 25 Hydroxy (Vit-D Deficiency, Fractures)   Asymptomatic menopausal state       Relevant Orders   DG Bone Density   Comprehensive metabolic panel   VITAMIN D 25 Hydroxy (Vit-D Deficiency, Fractures)   Vitamin D deficiency       Relevant Orders   VITAMIN D 25 Hydroxy (Vit-D Deficiency, Fractures)      -we discussed possible serious and likely etiologies, options for evaluation and workup, limitations of telemedicine visit vs in person visit, treatment, treatment risks and precautions. Pt prefers to treat via telemedicine empirically rather then risking or undertaking an in person visit at this moment.  .   I discussed the assessment and treatment plan with the patient. The patient was provided an opportunity to ask questions and all were answered. The patient agreed with the plan and demonstrated an understanding of the instructions.   The patient was advised to call back or seek an in-person evaluation if the symptoms worsen or if the condition fails to improve as anticipated.   Mable Paris, FNP

## 2019-11-28 NOTE — Assessment & Plan Note (Signed)
Controlled. Continue lipitor 10mg 

## 2019-11-28 NOTE — Patient Instructions (Signed)
Please call  and schedule your  bone density scan as discussed.   [place]  [place], [place]   Stay safe!

## 2019-11-28 NOTE — Assessment & Plan Note (Signed)
Very well controlled. Continue amlodipine 5mg  and hctz 25mg  qd with potassium chloride 76meq qod

## 2019-12-05 DIAGNOSIS — Z23 Encounter for immunization: Secondary | ICD-10-CM | POA: Diagnosis not present

## 2019-12-09 ENCOUNTER — Other Ambulatory Visit: Payer: Self-pay

## 2019-12-09 ENCOUNTER — Other Ambulatory Visit (INDEPENDENT_AMBULATORY_CARE_PROVIDER_SITE_OTHER): Payer: Medicare Other

## 2019-12-09 DIAGNOSIS — Z13820 Encounter for screening for osteoporosis: Secondary | ICD-10-CM | POA: Diagnosis not present

## 2019-12-09 DIAGNOSIS — Z780 Asymptomatic menopausal state: Secondary | ICD-10-CM

## 2019-12-09 DIAGNOSIS — I10 Essential (primary) hypertension: Secondary | ICD-10-CM | POA: Diagnosis not present

## 2019-12-09 DIAGNOSIS — E559 Vitamin D deficiency, unspecified: Secondary | ICD-10-CM

## 2019-12-09 DIAGNOSIS — Z23 Encounter for immunization: Secondary | ICD-10-CM

## 2019-12-09 LAB — COMPREHENSIVE METABOLIC PANEL
ALT: 11 U/L (ref 0–35)
AST: 16 U/L (ref 0–37)
Albumin: 3.7 g/dL (ref 3.5–5.2)
Alkaline Phosphatase: 67 U/L (ref 39–117)
BUN: 10 mg/dL (ref 6–23)
CO2: 31 mEq/L (ref 19–32)
Calcium: 9.6 mg/dL (ref 8.4–10.5)
Chloride: 102 mEq/L (ref 96–112)
Creatinine, Ser: 0.93 mg/dL (ref 0.40–1.20)
GFR: 63.94 mL/min (ref >60.00)
Glucose, Bld: 101 mg/dL — ABNORMAL HIGH (ref 70–99)
Potassium: 3.5 mEq/L (ref 3.5–5.1)
Sodium: 141 mEq/L (ref 135–145)
Total Bilirubin: 0.7 mg/dL (ref 0.2–1.2)
Total Protein: 6.6 g/dL (ref 6.0–8.3)

## 2019-12-09 LAB — VITAMIN D 25 HYDROXY (VIT D DEFICIENCY, FRACTURES): VITD: 13.98 ng/mL — ABNORMAL LOW (ref 30.00–100.00)

## 2019-12-10 ENCOUNTER — Other Ambulatory Visit: Payer: Self-pay | Admitting: Family

## 2019-12-10 DIAGNOSIS — E559 Vitamin D deficiency, unspecified: Secondary | ICD-10-CM

## 2019-12-10 MED ORDER — CHOLECALCIFEROL 1.25 MG (50000 UT) PO TABS: ORAL_TABLET | ORAL | 0 refills | Status: DC

## 2020-01-02 ENCOUNTER — Other Ambulatory Visit: Payer: Self-pay | Admitting: Family

## 2020-01-02 DIAGNOSIS — E559 Vitamin D deficiency, unspecified: Secondary | ICD-10-CM

## 2020-01-07 ENCOUNTER — Encounter: Payer: Self-pay | Admitting: Family

## 2020-01-22 ENCOUNTER — Other Ambulatory Visit: Payer: Self-pay | Admitting: Family

## 2020-01-22 DIAGNOSIS — E7800 Pure hypercholesterolemia, unspecified: Secondary | ICD-10-CM

## 2020-01-22 DIAGNOSIS — I10 Essential (primary) hypertension: Secondary | ICD-10-CM

## 2020-02-13 ENCOUNTER — Other Ambulatory Visit: Payer: Self-pay | Admitting: Family

## 2020-02-26 ENCOUNTER — Telehealth: Payer: Self-pay

## 2020-02-26 NOTE — Telephone Encounter (Signed)
Pt has a bone density order that will expire 02/28/20 and she needs a new order because their first available appt is 03/08/20. She is asking we call her once the new order is submitted so she can call and reschedule her appointment.

## 2020-02-27 ENCOUNTER — Other Ambulatory Visit: Payer: Self-pay

## 2020-02-27 DIAGNOSIS — Z780 Asymptomatic menopausal state: Secondary | ICD-10-CM

## 2020-02-27 DIAGNOSIS — Z13820 Encounter for screening for osteoporosis: Secondary | ICD-10-CM

## 2020-02-27 NOTE — Telephone Encounter (Signed)
I called patient & LM that DEXA was reordered for her.

## 2020-03-11 ENCOUNTER — Ambulatory Visit
Admission: RE | Admit: 2020-03-11 | Discharge: 2020-03-11 | Disposition: A | Discharge status: Home / Self Care | Payer: Medicare Other | Source: Ambulatory Visit | Attending: Family | Admitting: Family

## 2020-03-11 ENCOUNTER — Other Ambulatory Visit: Payer: Self-pay

## 2020-03-11 DIAGNOSIS — Z13820 Encounter for screening for osteoporosis: Secondary | ICD-10-CM | POA: Insufficient documentation

## 2020-03-11 DIAGNOSIS — Z720 Tobacco use: Secondary | ICD-10-CM | POA: Diagnosis not present

## 2020-03-11 DIAGNOSIS — Z780 Asymptomatic menopausal state: Secondary | ICD-10-CM | POA: Diagnosis not present

## 2020-03-11 DIAGNOSIS — M85851 Other specified disorders of bone density and structure, right thigh: Secondary | ICD-10-CM | POA: Diagnosis not present

## 2020-03-11 DIAGNOSIS — Z90710 Acquired absence of both cervix and uterus: Secondary | ICD-10-CM | POA: Diagnosis not present

## 2020-03-11 DIAGNOSIS — Z8709 Personal history of other diseases of the respiratory system: Secondary | ICD-10-CM | POA: Diagnosis not present

## 2020-03-15 ENCOUNTER — Encounter: Payer: Self-pay | Admitting: Family

## 2020-03-15 DIAGNOSIS — M8580 Other specified disorders of bone density and structure, unspecified site: Secondary | ICD-10-CM | POA: Insufficient documentation

## 2020-03-31 ENCOUNTER — Ambulatory Visit: Payer: Medicare Other | Admitting: Family

## 2020-04-15 ENCOUNTER — Ambulatory Visit (INDEPENDENT_AMBULATORY_CARE_PROVIDER_SITE_OTHER): Payer: Medicare Other

## 2020-04-15 VITALS — Ht 63.0 in | Wt 165.0 lb

## 2020-04-15 DIAGNOSIS — Z0000 Encounter for general adult medical examination without abnormal findings: Secondary | ICD-10-CM

## 2020-04-15 DIAGNOSIS — E876 Hypokalemia: Secondary | ICD-10-CM | POA: Diagnosis not present

## 2020-04-15 DIAGNOSIS — Z1231 Encounter for screening mammogram for malignant neoplasm of breast: Secondary | ICD-10-CM | POA: Diagnosis not present

## 2020-04-15 MED ORDER — POTASSIUM CHLORIDE 20 MEQ PO PACK: 20.0000 meq | PACK | ORAL | 5 refills | Status: DC

## 2020-04-15 NOTE — Progress Notes (Signed)
Subjective:   Valerie Larson is a 65 y.o. female who presents for Medicare Annual (Subsequent) preventive examination.  Review of Systems    No ROS.  Medicare Wellness Virtual Visit.    Cardiac Risk Factors include: advanced age (>58men, >28 women);hypertension     Objective:    Today's Vitals   04/15/20 0908  Weight: 165 lb (74.8 kg)  Height: 5\' 3"  (1.6 m)   Body mass index is 29.23 kg/m.  Advanced Directives 04/15/2020 05/22/2019 04/15/2019  Does Patient Have a Medical Advance Directive? No No No  Would patient like information on creating a medical advance directive? No - Patient declined - No - Patient declined    Current Medications (verified) Outpatient Encounter Medications as of 04/15/2020  Medication Sig  . albuterol (VENTOLIN HFA) 108 (90 Base) MCG/ACT inhaler TAKE 2 PUFFS BY MOUTH EVERY 6 HOURS AS NEEDED FOR WHEEZE OR SHORTNESS OF BREATH  . amLODipine-atorvastatin (CADUET) 5-10 MG tablet TAKE 1 TABLET BY MOUTH EVERY DAY  . azelastine (ASTELIN) 0.1 % nasal spray SMARTSIG:2 Spray(s) Both Nares Every 12 Hours PRN  . Cholecalciferol 1.25 MG (50000 UT) TABS 50,000 units PO qwk for 8 weeks.  . hydrochlorothiazide (HYDRODIURIL) 25 MG tablet TAKE 1 TABLET BY MOUTH EVERY DAY  . loratadine (CLARITIN) 10 MG tablet Take 10 mg by mouth daily.  . Multiple Vitamins-Minerals (PRESERVISION AREDS 2+MULTI VIT PO) Take 1 tablet by mouth.  . potassium chloride (KLOR-CON) 20 MEQ packet Take 20 mEq by mouth every other day.  . [DISCONTINUED] potassium chloride (KLOR-CON) 20 MEQ packet Take 20 mEq by mouth every other day.   No facility-administered encounter medications on file as of 04/15/2020.    Allergies (verified) Patient has no known allergies.   History: Past Medical History:  Diagnosis Date  . Chicken pox   . Emphysema of lung ([place])   . Hyperlipidemia   . Hypertension    Past Surgical History:  Procedure Laterality Date  . ABDOMINAL HYSTERECTOMY  2000   complete  hysterectomy, no ovaries.   . APPENDECTOMY  1974  . CHOLECYSTECTOMY    . COLONOSCOPY WITH PROPOFOL N/A 05/22/2019   Procedure: COLONOSCOPY WITH PROPOFOL;  Surgeon: Jonathon Bellows, MD;  Location: [facility] ENDOSCOPY;  Service: Gastroenterology;  Laterality: N/A;  . DIAGNOSTIC LAPAROSCOPY    . GALLBLADDER SURGERY    . OVARIAN CYST SURGERY  2004  . TONSILLECTOMY    . TONSILLECTOMY     Family History  Problem Relation Age of Onset  . Cancer Mother   . Hyperlipidemia Mother   . Hypertension Mother   . Ovarian cancer Mother 66  . COPD Father   . Diabetes Father   . Hyperlipidemia Father   . Hypertension Sister   . Hypertension Brother    Social History   Socioeconomic History  . Marital status: Married    Spouse name: Not on file  . Number of children: Not on file  . Years of education: Not on file  . Highest education level: Not on file  Occupational History  . Not on file  Tobacco Use  . Smoking status: Former Smoker    Quit date: 11/15/2019    Years since quitting: 0.4  . Smokeless tobacco: Never Used  Vaping Use  . Vaping Use: Never used  Substance and Sexual Activity  . Alcohol use: Never  . Drug use: Never  . Sexual activity: Not on file  Other Topics Concern  . Not on file  Social History Narrative  Grandson 3 , diagnosed with leukemia at 2. He is doing well, but she still worries about him.    Social Determinants of Health   Financial Resource Strain: Low Risk   . Difficulty of Paying Living Expenses: Not hard at all  Food Insecurity: No Food Insecurity  . Worried About Charity fundraiser in the Last Year: Never true  . Ran Out of Food in the Last Year: Never true  Transportation Needs: No Transportation Needs  . Lack of Transportation (Medical): No  . Lack of Transportation (Non-Medical): No  Physical Activity: Not on file  Stress: No Stress Concern Present  . Feeling of Stress : Not at all  Social Connections: Unknown  . Frequency of Communication with  Friends and Family: More than three times a week  . Frequency of Social Gatherings with Friends and Family: More than three times a week  . Attends Religious Services: Not on file  . Active Member of Clubs or Organizations: Not on file  . Attends Archivist Meetings: Not on file  . Marital Status: Not on file    Tobacco Counseling Counseling given: Not Answered   Clinical Intake:  Pre-visit preparation completed: Yes        Diabetes: No  How often do you need to have someone help you when you read instructions, pamphlets, or other written materials from your doctor or pharmacy?: 1 - Never    Interpreter Needed?: No      Activities of Daily Living In your present state of health, do you have any difficulty performing the following activities: 04/15/2020  Hearing? N  Vision? N  Difficulty concentrating or making decisions? N  Walking or climbing stairs? N  Dressing or bathing? N  Doing errands, shopping? N  Preparing Food and eating ? N  Using the Toilet? N  In the past six months, have you accidently leaked urine? N  Do you have problems with loss of bowel control? N  Managing your Medications? N  Managing your Finances? N  Housekeeping or managing your Housekeeping? N  Some recent data might be hidden    Patient Care Team: Burnard Hawthorne, FNP as PCP - General (Family Medicine)  Indicate any recent Medical Services you may have received from other than [facility] providers in the past year (date may be approximate).     Assessment:   This is a routine wellness examination for [name].  I connected with [place] today by telephone and verified that I am speaking with the correct person using two identifiers. Location patient: home Location provider: work Persons participating in the virtual visit: patient, Marine scientist.    I discussed the limitations, risks, security and privacy concerns of performing an evaluation and management service by telephone and the  availability of in person appointments. The patient expressed understanding and verbally consented to this telephonic visit.    Interactive audio and video telecommunications were attempted between this provider and patient, however failed, due to patient having technical difficulties OR patient did not have access to video capability.  We continued and completed visit with audio only.  Some vital signs may be absent or patient reported.   Hearing/Vision screen  Hearing Screening   125Hz  250Hz  500Hz  1000Hz  2000Hz  3000Hz  4000Hz  6000Hz  8000Hz   Right ear:           Left ear:           Comments: Patient is able to hear conversational tones without difficulty.  No issues reported.  Vision Screening Comments: Followed by [facility] Wears corrective lenses Visual acuity not assessed, virtual visit.  They have seen their ophthalmologist in the last 12 months.     Dietary issues and exercise activities discussed: Current Exercise Habits: Home exercise routine, Intensity: Mild  Healthy diet Good water intake  Goals      Patient Stated   .  I want to walk more for exercise (pt-stated)      Depression Screen PHQ 2/9 Scores 04/15/2020 04/15/2019 01/20/2019  PHQ - 2 Score 0 0 0    Fall Risk Fall Risk  04/15/2020 04/15/2019 01/20/2019  Falls in the past year? 0 0 0  Number falls in past yr: 0 - -  Injury with Fall? 0 - -  Follow up Falls evaluation completed Falls evaluation completed Falls evaluation completed    Bieber: Handrails in use when climbing stairs? Yes Home free of loose throw rugs in walkways, pet beds, electrical cords, etc? Yes  Adequate lighting in your home to reduce risk of falls? Yes   ASSISTIVE DEVICES UTILIZED TO PREVENT FALLS: Life alert? No  Use of a cane, walker or w/c? No   TIMED UP AND GO: Was the test performed? No . Virtual visit.   Cognitive Function: Patient is alert and oriented x3.  Denies difficulty  focusing, making decisions, memory loss.  Enjoys playing word games for brain health.  MMSE/6CIT deferred. Normal by direct communication/observation.    MMSE - Mini Mental State Exam 04/15/2019  Not completed: Unable to complete     6CIT Screen 04/15/2019  What Year? 0 points  What month? 0 points  What time? 0 points    Immunizations Immunization History  Administered Date(s) Administered  . Fluad Quad(high Dose 65+) 02/12/2019, 12/09/2019  . PFIZER(Purple Top)SARS-COV-2 Vaccination 03/17/2019, 04/05/2019  . Pneumococcal Polysaccharide-23 02/11/2007    TDAP status: Due, Education has been provided regarding the importance of this vaccine. Advised may receive this vaccine at local pharmacy or Health Dept. Aware to provide a copy of the vaccination record if obtained from local pharmacy or Health Dept. Verbalized acceptance and understanding. Deferred.   PNA vaccine- plans to receive in office with pcp next visit. Deferred.   Covid booster- completed. Agrees to update immunization record.   Health Maintenance Health Maintenance  Topic Date Due  . PNA vac Low Risk Adult (1 of 2 - PCV13) 03/09/2018  . COVID-19 Vaccine (3 - Booster for Pfizer series) 10/03/2019  . TETANUS/TDAP  04/15/2021 (Originally 03/09/1974)  . MAMMOGRAM  10/02/2021  . COLONOSCOPY (Pts 45-64yrs Insurance coverage will need to be confirmed)  05/21/2024  . INFLUENZA VACCINE  Completed  . DEXA SCAN  Completed  . Hepatitis C Screening  Completed  . HPV VACCINES  Aged Out   Colorectal cancer screening: Type of screening: Colonoscopy. Completed 05/22/19. Repeat every 5 years  Mammogram status: Completed 10/03/19. Repeat every year  Bone density- completed 03/11/20. Not yet released.   Vision Screening: Recommended annual ophthalmology exams for early detection of glaucoma and other disorders of the eye. Is the patient up to date with their annual eye exam?  Yes   Dental Screening: Recommended annual dental exams  for proper oral hygiene.  Community Resource Referral / Chronic Care Management: CRR required this visit?  No   CCM required this visit?  No      Plan:   Keep all routine maintenance appointments.   Follow up 04/21/20  Mammogram ordered.  Potassium refill sent to pharmacy.   I have personally reviewed and noted the following in the patient's chart:   . Medical and social history . Use of alcohol, tobacco or illicit drugs  . Current medications and supplements . Functional ability and status . Nutritional status . Physical activity . Advanced directives . List of other physicians . Hospitalizations, surgeries, and ER visits in previous 12 months . Vitals . Screenings to include cognitive, depression, and falls . Referrals and appointments  In addition, I have reviewed and discussed with patient certain preventive protocols, quality metrics, and best practice recommendations. A written personalized care plan for preventive services as well as general preventive health recommendations were provided to patient via mychart.     Varney Biles, LPN   08/15/2955

## 2020-04-15 NOTE — Patient Instructions (Addendum)
Valerie Larson , Thank you for taking time to come for your Medicare Wellness Visit. I appreciate your ongoing commitment to your health goals. Please review the following plan we discussed and let me know if I can assist you in the future.   These are the goals we discussed: Goals      Patient Stated     I want to walk more for exercise (pt-stated)       This is a list of the screening recommended for you and due dates:  Health Maintenance  Topic Date Due   Pneumonia vaccines (1 of 2 - PCV13) 03/09/2018   COVID-19 Vaccine (3 - Booster for Pfizer series) 10/03/2019   Tetanus Vaccine  04/15/2021*   Mammogram  10/02/2021   Colon Cancer Screening  05/21/2024   Flu Shot  Completed   DEXA scan (bone density measurement)  Completed    Hepatitis C: One time screening is recommended by Center for Disease Control  (CDC) for  adults born from [birth year] through 1967.   Completed   HPV Vaccine  Aged Out  *Topic was postponed. The date shown is not the original due date.   Keep all routine maintenance appointments.   Follow up 04/21/20  Mammogram ordered.   Advanced directives: not yet completed.   Conditions/risks identified: none new.   Follow up in one year for your annual wellness visit.    Preventive Care 12 Years and Older, Female Preventive care refers to lifestyle choices and visits with your health care provider that can promote health and wellness. What does preventive care include?  A yearly physical exam. This is also called an annual well check.  Dental exams once or twice a year.  Routine eye exams. Ask your health care provider how often you should have your eyes checked.  Personal lifestyle choices, including:  Daily care of your teeth and gums.  Regular physical activity.  Eating a healthy diet.  Avoiding tobacco and drug use.  Limiting alcohol use.  Practicing safe sex.  Taking low-dose aspirin every day.  Taking vitamin and mineral supplements as  recommended by your health care provider. What happens during an annual well check? The services and screenings done by your health care provider during your annual well check will depend on your age, overall health, lifestyle risk factors, and family history of disease. Counseling  Your health care provider may ask you questions about your:  Alcohol use.  Tobacco use.  Drug use.  Emotional well-being.  Home and relationship well-being.  Sexual activity.  Eating habits.  History of falls.  Memory and ability to understand (cognition).  Work and work Statistician.  Reproductive health. Screening  You may have the following tests or measurements:  Height, weight, and BMI.  Blood pressure.  Lipid and cholesterol levels. These may be checked every 5 years, or more frequently if you are over 22 years old.  Skin check.  Lung cancer screening. You may have this screening every year starting at age 40 if you have a 30-pack-year history of smoking and currently smoke or have quit within the past 15 years.  Fecal occult blood test (FOBT) of the stool. You may have this test every year starting at age 66.  Flexible sigmoidoscopy or colonoscopy. You may have a sigmoidoscopy every 5 years or a colonoscopy every 10 years starting at age 61.  Hepatitis C blood test.  Hepatitis B blood test.  Sexually transmitted disease (STD) testing.  Diabetes screening. This is done  by checking your blood sugar (glucose) after you have not eaten for a while (fasting). You may have this done every 1-3 years.  Bone density scan. This is done to screen for osteoporosis. You may have this done starting at age 43.  Mammogram. This may be done every 1-2 years. Talk to your health care provider about how often you should have regular mammograms. Talk with your health care provider about your test results, treatment options, and if necessary, the need for more tests. Vaccines  Your health care  provider may recommend certain vaccines, such as:  Influenza vaccine. This is recommended every year.  Tetanus, diphtheria, and acellular pertussis (Tdap, Td) vaccine. You may need a Td booster every 10 years.  Zoster vaccine. You may need this after age 32.  Pneumococcal 13-valent conjugate (PCV13) vaccine. One dose is recommended after age 14.  Pneumococcal polysaccharide (PPSV23) vaccine. One dose is recommended after age 59. Talk to your health care provider about which screenings and vaccines you need and how often you need them. This information is not intended to replace advice given to you by your health care provider. Make sure you discuss any questions you have with your health care provider. Document Released: 02/19/2015 Document Revised: 10/13/2015 Document Reviewed: 11/24/2014 Elsevier Interactive Patient Education  2017 [place] Prevention in the Home Falls can cause injuries. They can happen to people of all ages. There are many things you can do to make your home safe and to help prevent falls. What can I do on the outside of my home?  Regularly fix the edges of walkways and driveways and fix any cracks.  Remove anything that might make you trip as you walk through a door, such as a raised step or threshold.  Trim any bushes or trees on the path to your home.  Use bright outdoor lighting.  Clear any walking paths of anything that might make someone trip, such as rocks or tools.  Regularly check to see if handrails are loose or broken. Make sure that both sides of any steps have handrails.  Any raised decks and porches should have guardrails on the edges.  Have any leaves, snow, or ice cleared regularly.  Use sand or salt on walking paths during winter.  Clean up any spills in your garage right away. This includes oil or grease spills. What can I do in the bathroom?  Use night lights.  Install grab bars by the toilet and in the tub and shower. Do  not use towel bars as grab bars.  Use non-skid mats or decals in the tub or shower.  If you need to sit down in the shower, use a plastic, non-slip stool.  Keep the floor dry. Clean up any water that spills on the floor as soon as it happens.  Remove soap buildup in the tub or shower regularly.  Attach bath mats securely with double-sided non-slip rug tape.  Do not have throw rugs and other things on the floor that can make you trip. What can I do in the bedroom?  Use night lights.  Make sure that you have a light by your bed that is easy to reach.  Do not use any sheets or blankets that are too big for your bed. They should not hang down onto the floor.  Have a firm chair that has side arms. You can use this for support while you get dressed.  Do not have throw rugs and other things on  the floor that can make you trip. What can I do in the kitchen?  Clean up any spills right away.  Avoid walking on wet floors.  Keep items that you use a lot in easy-to-reach places.  If you need to reach something above you, use a strong step stool that has a grab bar.  Keep electrical cords out of the way.  Do not use floor polish or wax that makes floors slippery. If you must use wax, use non-skid floor wax.  Do not have throw rugs and other things on the floor that can make you trip. What can I do with my stairs?  Do not leave any items on the stairs.  Make sure that there are handrails on both sides of the stairs and use them. Fix handrails that are broken or loose. Make sure that handrails are as long as the stairways.  Check any carpeting to make sure that it is firmly attached to the stairs. Fix any carpet that is loose or worn.  Avoid having throw rugs at the top or bottom of the stairs. If you do have throw rugs, attach them to the floor with carpet tape.  Make sure that you have a light switch at the top of the stairs and the bottom of the stairs. If you do not have them,  ask someone to add them for you. What else can I do to help prevent falls?  Wear shoes that:  Do not have high heels.  Have rubber bottoms.  Are comfortable and fit you well.  Are closed at the toe. Do not wear sandals.  If you use a stepladder:  Make sure that it is fully opened. Do not climb a closed stepladder.  Make sure that both sides of the stepladder are locked into place.  Ask someone to hold it for you, if possible.  Clearly mark and make sure that you can see:  Any grab bars or handrails.  First and last steps.  Where the edge of each step is.  Use tools that help you move around (mobility aids) if they are needed. These include:  Canes.  Walkers.  Scooters.  Crutches.  Turn on the lights when you go into a dark area. Replace any light bulbs as soon as they burn out.  Set up your furniture so you have a clear path. Avoid moving your furniture around.  If any of your floors are uneven, fix them.  If there are any pets around you, be aware of where they are.  Review your medicines with your doctor. Some medicines can make you feel dizzy. This can increase your chance of falling. Ask your doctor what other things that you can do to help prevent falls. This information is not intended to replace advice given to you by your health care provider. Make sure you discuss any questions you have with your health care provider. Document Released: 11/19/2008 Document Revised: 07/01/2015 Document Reviewed: 02/27/2014 Elsevier Interactive Patient Education  2017 [employer].

## 2020-04-21 ENCOUNTER — Ambulatory Visit (INDEPENDENT_AMBULATORY_CARE_PROVIDER_SITE_OTHER): Payer: Medicare Other | Admitting: Family

## 2020-04-21 ENCOUNTER — Encounter: Payer: Self-pay | Admitting: Family

## 2020-04-21 ENCOUNTER — Other Ambulatory Visit: Payer: Self-pay

## 2020-04-21 VITALS — BP 120/78 | HR 89 | Temp 98.0°F | Ht 63.0 in | Wt 174.2 lb

## 2020-04-21 DIAGNOSIS — I10 Essential (primary) hypertension: Secondary | ICD-10-CM

## 2020-04-21 DIAGNOSIS — E559 Vitamin D deficiency, unspecified: Secondary | ICD-10-CM | POA: Diagnosis not present

## 2020-04-21 DIAGNOSIS — E785 Hyperlipidemia, unspecified: Secondary | ICD-10-CM

## 2020-04-21 DIAGNOSIS — M8580 Other specified disorders of bone density and structure, unspecified site: Secondary | ICD-10-CM

## 2020-04-21 DIAGNOSIS — R7303 Prediabetes: Secondary | ICD-10-CM | POA: Diagnosis not present

## 2020-04-21 NOTE — Assessment & Plan Note (Signed)
Pending lipid panel. Continue atorvastatin 10mg 

## 2020-04-21 NOTE — Progress Notes (Signed)
Subjective:    Patient ID: Valerie Larson, female    DOB: Jun 25, 1955, 65 y.o.   MRN: [id number]  CC: Valerie Larson is a 65 y.o. female who presents today for follow up.   HPI: Feels well today  H/p prediabetes. She drinks 12 ounce of pepsi daily. She plans to start walking program with husband.  H/o vitamin D deficiency. No longer on vitamin D.   HTN - compliant with amlodipine 5mg , hctz 25mg  with potassium chloride 48meq qod. No cp, sob.   HLD- compliant with atorvastatin 10mg .   Due mammogram  She had pneumococcal 23; due for one dose of PCV20. She declines today.   HISTORY:  Past Medical History:  Diagnosis Date  . Chicken pox   . Emphysema of lung ([place])   . Hyperlipidemia   . Hypertension    Past Surgical History:  Procedure Laterality Date  . ABDOMINAL HYSTERECTOMY  2000   complete hysterectomy, no ovaries. Done for noncancerous reasons , ovarian cyst.   . APPENDECTOMY  1974  . CHOLECYSTECTOMY    . COLONOSCOPY WITH PROPOFOL N/A 05/22/2019   Procedure: COLONOSCOPY WITH PROPOFOL;  Surgeon: Jonathon Bellows, MD;  Location: [facility] ENDOSCOPY;  Service: Gastroenterology;  Laterality: N/A;  . DIAGNOSTIC LAPAROSCOPY    . GALLBLADDER SURGERY    . OVARIAN CYST SURGERY  2004  . TONSILLECTOMY    . TONSILLECTOMY     Family History  Problem Relation Age of Onset  . Cancer Mother   . Hyperlipidemia Mother   . Hypertension Mother   . Ovarian cancer Mother 66  . COPD Father   . Diabetes Father   . Hyperlipidemia Father   . Hypertension Sister   . Hypertension Brother     Allergies: Patient has no known allergies. Current Outpatient Medications on File Prior to Visit  Medication Sig Dispense Refill  . albuterol (VENTOLIN HFA) 108 (90 Base) MCG/ACT inhaler TAKE 2 PUFFS BY MOUTH EVERY 6 HOURS AS NEEDED FOR WHEEZE OR SHORTNESS OF BREATH 18 g 3  . amLODipine-atorvastatin (CADUET) 5-10 MG tablet TAKE 1 TABLET BY MOUTH EVERY DAY 90 tablet 1  . azelastine (ASTELIN) 0.1 % nasal  spray SMARTSIG:2 Spray(s) Both Nares Every 12 Hours PRN    . hydrochlorothiazide (HYDRODIURIL) 25 MG tablet TAKE 1 TABLET BY MOUTH EVERY DAY 90 tablet 1  . loratadine (CLARITIN) 10 MG tablet Take 10 mg by mouth daily.    . Multiple Vitamins-Minerals (PRESERVISION AREDS 2+MULTI VIT PO) Take 1 tablet by mouth.    . potassium chloride (KLOR-CON) 20 MEQ packet Take 20 mEq by mouth every other day. 30 packet 5   No current facility-administered medications on file prior to visit.    Social History   Tobacco Use  . Smoking status: Former Smoker    Quit date: 11/15/2019    Years since quitting: 0.4  . Smokeless tobacco: Never Used  Vaping Use  . Vaping Use: Never used  Substance Use Topics  . Alcohol use: Never  . Drug use: Never    Review of Systems  Constitutional: Negative for chills and fever.  Respiratory: Negative for cough.   Cardiovascular: Negative for chest pain and palpitations.  Gastrointestinal: Negative for nausea and vomiting.      Objective:    BP 120/78   Pulse 89   Temp 98 F (36.7 C)   Ht 5\' 3"  (1.6 m)   Wt 174 lb 3.2 oz (79 kg)   SpO2 99%   BMI 30.86 kg/m  BP Readings from Last 3 Encounters:  04/21/20 120/78  11/28/19 108/73  05/22/19 (!) 140/94   Wt Readings from Last 3 Encounters:  04/21/20 174 lb 3.2 oz (79 kg)  04/15/20 165 lb (74.8 kg)  11/28/19 165 lb (74.8 kg)    Physical Exam Vitals reviewed.  Constitutional:      Appearance: She is well-developed.  Eyes:     Conjunctiva/sclera: Conjunctivae normal.  Cardiovascular:     Rate and Rhythm: Normal rate and regular rhythm.     Pulses: Normal pulses.     Heart sounds: Normal heart sounds.  Pulmonary:     Effort: Pulmonary effort is normal.     Breath sounds: Normal breath sounds. No wheezing, rhonchi or rales.  Skin:    General: Skin is warm and dry.  Neurological:     Mental Status: She is alert.  Psychiatric:        Speech: Speech normal.        Behavior: Behavior normal.         Thought Content: Thought content normal.        Assessment & Plan:   Problem List Items Addressed This Visit      Cardiovascular and Mediastinum   Essential hypertension - Primary    Excellent control. Continue amlodipine 5mg , hctz 25mg  with potassium chloride 73meq qod.      Relevant Orders   CBC with Differential/Platelet   Comprehensive metabolic panel   Lipid panel   TSH   VITAMIN D 25 Hydroxy (Vit-D Deficiency, Fractures)     Musculoskeletal and Integument   Osteopenia   Relevant Orders   VITAMIN D 25 Hydroxy (Vit-D Deficiency, Fractures)     Other   Hyperlipidemia    Pending lipid panel. Continue atorvastatin 10mg       Prediabetes    Counseled on low GI diet. Pending A1C.       Relevant Orders   Hemoglobin A1c    Other Visit Diagnoses    Vitamin D deficiency       Relevant Orders   VITAMIN D 25 Hydroxy (Vit-D Deficiency, Fractures)       I have discontinued Valerie Larson's Cholecalciferol. I am also having her maintain her loratadine, azelastine, Multiple Vitamins-Minerals (PRESERVISION AREDS 2+MULTI VIT PO), albuterol, hydrochlorothiazide, amLODipine-atorvastatin, and potassium chloride.   No orders of the defined types were placed in this encounter.   Return precautions given.   Risks, benefits, and alternatives of the medications and treatment plan prescribed today were discussed, and patient expressed understanding.   Education regarding symptom management and diagnosis given to patient on AVS.  Continue to follow with Burnard Hawthorne, FNP for routine health maintenance.   Drake and I agreed with plan.   Mable Paris, FNP

## 2020-04-21 NOTE — Assessment & Plan Note (Signed)
Counseled on low GI diet. Pending A1C.

## 2020-04-21 NOTE — Patient Instructions (Addendum)
Your dexa scan shows osteopenia. Please ensure you are on both calcium and vitamin D per below.    For post menopausal women, guidelines recommend a diet with 1200 mg of Calcium per day. If you are eating calcium rich foods, you do not need a calcium supplement. The body better absorbs the calcium that you eat over supplementation. If you do supplement, I recommend not supplementing the full 1200 mg/ day as this can lead to increased risk of cardiovascular disease. I recommend Calcium Citrate over the counter, and you may take a total of 600 to 800 mg per day in divided doses with meals for best absorption.   For bone health, you need adequate vitamin D, and I recommend you supplement as it is harder to do so with diet alone. I recommend cholecalciferol 800 units daily.  Also, please ensure you are following a diet high in calcium -- research shows better outcomes with dietary sources including kale, yogurt, broccolii, cheese, okra, almonds- to name a few.     Also remember that exercise is a great medicine for maintain and preserve bone health. Advise moderate exercise for 30 minutes , 3 times per week.  This is  Dr. Lupita Dawn  example of a  "Low GI"  Diet:  It will allow you to lose 4 to 8  lbs  per month if you follow it carefully.  Your goal with exercise is a minimum of 30 minutes of aerobic exercise 5 days per week (Walking does not count once it becomes easy!)    All of the foods can be found at grocery stores and in bulk at [employer].  The Atkins protein bars and shakes are available in more varieties at [employer], [employer] and [place].     7 AM Breakfast:  Choose from the following:  Low carbohydrate Protein  Shakes (I recommend the  Premier Protein chocolate shakes,  EAS AdvantEdge "Carb Control" shakes  Or the Atkins shakes all are under 3 net carbs)     a scrambled egg/bacon/cheese burrito made with [employer]'s "carb balance" whole wheat tortilla  (about 10 net carbs )  Engineer, structural (basically a quiche without the pastry crust) that is eaten cold and very convenient way to get your eggs.  8 carbs)  If you make your own protein shakes, avoid bananas and pineapple,  And use low carb greek yogurt or original /unsweetened almond or soy milk    Avoid cereal and bananas, oatmeal and cream of wheat and grits. They are loaded with carbohydrates!   10 AM: high protein snack:  Protein bar by Atkins (the snack size, under 200 cal, usually < 6 net carbs).    A stick of cheese:  Around 1 carb,  100 cal     Dannon Light n Fit [place] Yogurt  (80 cal, 8 carbs)  Other so called "protein bars" and Greek yogurts tend to be loaded with carbohydrates.  Remember, in food advertising, the word "energy" is synonymous for " carbohydrate."  Lunch:   A Sandwich using the bread choices listed, Can use any  Eggs,  lunchmeat, grilled meat or canned tuna), avocado, regular mayo/mustard  and cheese.  A Salad using blue cheese, ranch,  Goddess or vinagrette,  Avoid taco shells, croutons or "confetti" and no "candied nuts" but regular nuts OK.   No pretzels, nabs  or chips.  Pickles and miniature sweet peppers are a good low carb alternative that provide a "crunch"  The bread is the only source of carbohydrate in a sandwich and  can be decreased by trying some of the attached alternatives to traditional loaf bread   Avoid "Low fat dressings, as well as [place] dressings They are loaded with sugar!   3 PM/ Mid day  Snack:  Consider  1 ounce of  almonds, walnuts, pistachios, pecans, peanuts,  Macadamia nuts or a nut medley.  Avoid "granola and granola bars "  Mixed nuts are ok in moderation as long as there are no raisins,  cranberries or dried fruit.   KIND bars are OK if you get the low glycemic index variety   Try the prosciutto/mozzarella cheese sticks by [employer]  In deli /backery section   High protein      6 PM  Dinner:     Meat/fowl/fish with a green  salad, and either broccoli, cauliflower, green beans, spinach, brussel sprouts or  Lima beans. DO NOT BREAD THE PROTEIN!!      There is a low carb pasta by Dreamfield's that is acceptable and tastes great: only 5 digestible carbs/serving.( All grocery stores but BJs carry it ) Several ready made meals are available low carb:   Try Michel Angelo's chicken piccata or chicken or eggplant parm over low carb pasta.(Lowes and BJs)   Marjory Lies Sanchez's "Carnitas" (pulled pork, no sauce,  0 carbs) or his beef pot roast to make a dinner burrito (at [employer]'s)  Pesto over low carb pasta (bj's sells a good quality pesto in the center refrigerated section of the deli   Try satueeing  Cheral Marker with mushroooms as a good side   [employer] Giant makes a mashed cauliflower that tastes like mashed potatoes  Whole wheat pasta is still full of digestible carbs and  Not as low in glycemic index as Dreamfield's.   Brown rice is still rice,  So skip the rice and noodles if you eat [place] or [place] (or at least limit to 1/2 cup)  9 PM snack :   Breyer's "low carb" fudgsicle or  ice cream bar (Carb Smart line), or  Weight Watcher's ice cream bar , or another "no sugar added" ice cream;  a serving of fresh berries/cherries with whipped cream   Cheese or DANNON'S LlGHT N FIT GREEK YOGURT  8 ounces of [place] unsweetened almond/cococunut milk    Treat yourself to a parfait made with whipped cream blueberiies, walnuts and vanilla greek yogurt  Avoid bananas, pineapple, grapes  and watermelon on a regular basis because they are high in sugar.  THINK OF THEM AS DESSERT  Remember that snack Substitutions should be less than 10 NET carbs per serving and meals < 20 carbs. Remember to subtract fiber grams to get the "net carbs."

## 2020-04-21 NOTE — Assessment & Plan Note (Signed)
Excellent control. Continue amlodipine 5mg , hctz 25mg  with potassium chloride 92meq qod.

## 2020-05-05 ENCOUNTER — Other Ambulatory Visit: Payer: Self-pay

## 2020-05-05 ENCOUNTER — Other Ambulatory Visit (INDEPENDENT_AMBULATORY_CARE_PROVIDER_SITE_OTHER): Payer: Medicare Other

## 2020-05-05 DIAGNOSIS — I10 Essential (primary) hypertension: Secondary | ICD-10-CM

## 2020-05-05 DIAGNOSIS — M8580 Other specified disorders of bone density and structure, unspecified site: Secondary | ICD-10-CM

## 2020-05-05 DIAGNOSIS — R7303 Prediabetes: Secondary | ICD-10-CM

## 2020-05-05 DIAGNOSIS — E559 Vitamin D deficiency, unspecified: Secondary | ICD-10-CM | POA: Diagnosis not present

## 2020-05-05 LAB — LIPID PANEL
Cholesterol: 152 mg/dL (ref 0–200)
HDL: 34.4 mg/dL — ABNORMAL LOW (ref >39.00)
LDL Cholesterol: 78 mg/dL (ref 0–99)
NonHDL: 117.25
Total CHOL/HDL Ratio: 4
Triglycerides: 194 mg/dL — ABNORMAL HIGH (ref 0.0–149.0)
VLDL: 38.8 mg/dL (ref 0.0–40.0)

## 2020-05-05 LAB — COMPREHENSIVE METABOLIC PANEL
ALT: 10 U/L (ref 0–35)
AST: 16 U/L (ref 0–37)
Albumin: 3.8 g/dL (ref 3.5–5.2)
Alkaline Phosphatase: 67 U/L (ref 39–117)
BUN: 12 mg/dL (ref 6–23)
CO2: 31 mEq/L (ref 19–32)
Calcium: 9.8 mg/dL (ref 8.4–10.5)
Chloride: 102 mEq/L (ref 96–112)
Creatinine, Ser: 0.91 mg/dL (ref 0.40–1.20)
GFR: 65.44 mL/min (ref >60.00)
Glucose, Bld: 110 mg/dL — ABNORMAL HIGH (ref 70–99)
Potassium: 3.4 mEq/L — ABNORMAL LOW (ref 3.5–5.1)
Sodium: 141 mEq/L (ref 135–145)
Total Bilirubin: 0.9 mg/dL (ref 0.2–1.2)
Total Protein: 6.7 g/dL (ref 6.0–8.3)

## 2020-05-05 LAB — CBC WITH DIFFERENTIAL/PLATELET
Basophils Absolute: 0.1 10*3/uL (ref 0.0–0.1)
Basophils Relative: 0.8 % (ref 0.0–3.0)
Eosinophils Absolute: 0.2 10*3/uL (ref 0.0–0.7)
Eosinophils Relative: 2.5 % (ref 0.0–5.0)
HCT: 43.8 % (ref 36.0–46.0)
Hemoglobin: 14.9 g/dL (ref 12.0–15.0)
Lymphocytes Relative: 46.7 % — ABNORMAL HIGH (ref 12.0–46.0)
Lymphs Abs: 3.1 10*3/uL (ref 0.7–4.0)
MCHC: 34 g/dL (ref 30.0–36.0)
MCV: 91.8 fl (ref 78.0–100.0)
Monocytes Absolute: 0.5 10*3/uL (ref 0.1–1.0)
Monocytes Relative: 7.8 % (ref 3.0–12.0)
Neutro Abs: 2.8 10*3/uL (ref 1.4–7.7)
Neutrophils Relative %: 42.2 % — ABNORMAL LOW (ref 43.0–77.0)
Platelets: 311 10*3/uL (ref 150.0–400.0)
RBC: 4.77 Mil/uL (ref 3.87–5.11)
RDW: 13.7 % (ref 11.5–15.5)
WBC: 6.6 10*3/uL (ref 4.0–10.5)

## 2020-05-05 LAB — TSH: TSH: 3.58 u[IU]/mL (ref 0.35–4.50)

## 2020-05-05 LAB — VITAMIN D 25 HYDROXY (VIT D DEFICIENCY, FRACTURES): VITD: 17.74 ng/mL — ABNORMAL LOW (ref 30.00–100.00)

## 2020-05-05 LAB — HEMOGLOBIN A1C: Hgb A1c MFr Bld: 5.8 % (ref 4.6–6.5)

## 2020-05-10 ENCOUNTER — Encounter: Payer: Self-pay | Admitting: Family

## 2020-05-10 ENCOUNTER — Other Ambulatory Visit: Payer: Self-pay | Admitting: Family

## 2020-05-10 DIAGNOSIS — E876 Hypokalemia: Secondary | ICD-10-CM

## 2020-05-10 DIAGNOSIS — E559 Vitamin D deficiency, unspecified: Secondary | ICD-10-CM

## 2020-05-10 MED ORDER — POTASSIUM CHLORIDE 20 MEQ PO PACK
20.0000 meq | PACK | Freq: Every day | ORAL | 1 refills | Status: DC
Start: 2020-05-10 — End: 2021-10-12

## 2020-05-10 MED ORDER — CHOLECALCIFEROL 1.25 MG (50000 UT) PO TABS: ORAL_TABLET | ORAL | 0 refills | Status: DC

## 2020-05-13 ENCOUNTER — Other Ambulatory Visit: Payer: Self-pay

## 2020-05-13 DIAGNOSIS — E785 Hyperlipidemia, unspecified: Secondary | ICD-10-CM

## 2020-05-13 MED ORDER — AMLODIPINE BESYLATE 5 MG PO TABS
5.0000 mg | ORAL_TABLET | Freq: Every day | ORAL | 3 refills | Status: DC
Start: 2020-05-13 — End: 2020-08-16

## 2020-05-13 MED ORDER — ATORVASTATIN CALCIUM 20 MG PO TABS: 20.0000 mg | ORAL_TABLET | Freq: Every day | ORAL | 3 refills | Status: DC

## 2020-05-17 ENCOUNTER — Telehealth: Payer: Self-pay

## 2020-05-17 NOTE — Telephone Encounter (Signed)
I have called and clarified with patient that she should continue to take HCTZ & d/c the Caduet.

## 2020-05-17 NOTE — Telephone Encounter (Signed)
Pt is confused about what medication she is supposed to d/c and which one she is supposed to continue. Please advise

## 2020-06-12 ENCOUNTER — Other Ambulatory Visit: Payer: Self-pay | Admitting: Family

## 2020-06-12 DIAGNOSIS — E559 Vitamin D deficiency, unspecified: Secondary | ICD-10-CM

## 2020-07-01 ENCOUNTER — Other Ambulatory Visit (INDEPENDENT_AMBULATORY_CARE_PROVIDER_SITE_OTHER): Payer: Medicare Other

## 2020-07-01 ENCOUNTER — Other Ambulatory Visit: Payer: Self-pay

## 2020-07-01 DIAGNOSIS — E785 Hyperlipidemia, unspecified: Secondary | ICD-10-CM

## 2020-07-01 LAB — COMPREHENSIVE METABOLIC PANEL
ALT: 9 U/L (ref 0–35)
AST: 15 U/L (ref 0–37)
Albumin: 3.8 g/dL (ref 3.5–5.2)
Alkaline Phosphatase: 69 U/L (ref 39–117)
BUN: 12 mg/dL (ref 6–23)
CO2: 32 mEq/L (ref 19–32)
Calcium: 10 mg/dL (ref 8.4–10.5)
Chloride: 103 mEq/L (ref 96–112)
Creatinine, Ser: 0.96 mg/dL (ref 0.40–1.20)
GFR: 61.31 mL/min (ref >60.00)
Glucose, Bld: 101 mg/dL — ABNORMAL HIGH (ref 70–99)
Potassium: 4 mEq/L (ref 3.5–5.1)
Sodium: 141 mEq/L (ref 135–145)
Total Bilirubin: 1.4 mg/dL — ABNORMAL HIGH (ref 0.2–1.2)
Total Protein: 6.6 g/dL (ref 6.0–8.3)

## 2020-07-03 ENCOUNTER — Other Ambulatory Visit: Payer: Self-pay | Admitting: Family

## 2020-07-03 DIAGNOSIS — L03313 Cellulitis of chest wall: Secondary | ICD-10-CM | POA: Diagnosis not present

## 2020-07-12 ENCOUNTER — Ambulatory Visit: Payer: Medicare Other | Admitting: Adult Health

## 2020-07-14 ENCOUNTER — Other Ambulatory Visit: Payer: Self-pay | Admitting: Family

## 2020-07-14 ENCOUNTER — Ambulatory Visit (INDEPENDENT_AMBULATORY_CARE_PROVIDER_SITE_OTHER): Payer: Medicare Other | Admitting: Adult Health

## 2020-07-14 ENCOUNTER — Encounter: Payer: Self-pay | Admitting: Adult Health

## 2020-07-14 ENCOUNTER — Other Ambulatory Visit: Payer: Self-pay

## 2020-07-14 VITALS — BP 118/82 | HR 96 | Temp 96.4°F | Ht 63.0 in | Wt 176.0 lb

## 2020-07-14 DIAGNOSIS — L03313 Cellulitis of chest wall: Secondary | ICD-10-CM | POA: Diagnosis not present

## 2020-07-14 DIAGNOSIS — L02213 Cutaneous abscess of chest wall: Secondary | ICD-10-CM | POA: Diagnosis not present

## 2020-07-14 DIAGNOSIS — R17 Unspecified jaundice: Secondary | ICD-10-CM

## 2020-07-14 LAB — CBC WITH DIFFERENTIAL/PLATELET
Basophils Absolute: 0.1 10*3/uL (ref 0.0–0.1)
Basophils Relative: 1 % (ref 0.0–3.0)
Eosinophils Absolute: 0.1 10*3/uL (ref 0.0–0.7)
Eosinophils Relative: 1.1 % (ref 0.0–5.0)
HCT: 45.2 % (ref 36.0–46.0)
Hemoglobin: 15.3 g/dL — ABNORMAL HIGH (ref 12.0–15.0)
Lymphocytes Relative: 36.8 % (ref 12.0–46.0)
Lymphs Abs: 2.9 10*3/uL (ref 0.7–4.0)
MCHC: 33.8 g/dL (ref 30.0–36.0)
MCV: 91.6 fl (ref 78.0–100.0)
Monocytes Absolute: 0.6 10*3/uL (ref 0.1–1.0)
Monocytes Relative: 8 % (ref 3.0–12.0)
Neutro Abs: 4.1 10*3/uL (ref 1.4–7.7)
Neutrophils Relative %: 53.1 % (ref 43.0–77.0)
Platelets: 349 10*3/uL (ref 150.0–400.0)
RBC: 4.94 Mil/uL (ref 3.87–5.11)
RDW: 13.3 % (ref 11.5–15.5)
WBC: 7.8 10*3/uL (ref 4.0–10.5)

## 2020-07-14 MED ORDER — CEPHALEXIN 250 MG/5ML PO SUSR
500.0000 mg | Freq: Four times a day (QID) | ORAL | 0 refills | Status: AC
Start: 2020-07-14 — End: 2020-07-24

## 2020-07-14 NOTE — Progress Notes (Signed)
CBC ok, hemoglobin mild elevation, will monitor, no acute findings and white blood cell count within normal limits.

## 2020-07-14 NOTE — Patient Instructions (Signed)
Cellulitis, Adult  Cellulitis is a skin infection. The infected area is often warm, red, swollen, and sore. It occurs most often in the arms and lower legs. It is very important to get treated for this condition. What are the causes? This condition is caused by bacteria. The bacteria enter through a break in the skin, such as a cut, burn, insect bite, open sore, or crack. What increases the risk? This condition is more likely to occur in people who:  Have a weak body defense system (immune system).  Have open cuts, burns, bites, or scrapes on the skin.  Are older than 67 years of age.  Have a blood sugar problem (diabetes).  Have a long-lasting (chronic) liver disease (cirrhosis) or kidney disease.  Are very overweight (obese).  Have a skin problem, such as: ? Itchy rash (eczema). ? Slow movement of blood in the veins (venous stasis). ? Fluid buildup below the skin (edema).  Have been treated with high-energy rays (radiation).  Use IV drugs. What are the signs or symptoms? Symptoms of this condition include:  Skin that is: ? Red. ? Streaking. ? Spotting. ? Swollen. ? Sore or painful when you touch it. ? Warm.  A fever.  Chills.  Blisters. How is this diagnosed? This condition is diagnosed based on:  Medical history.  Physical exam.  Blood tests.  Imaging tests. How is this treated? Treatment for this condition may include:  Medicines to treat infections or allergies.  Home care, such as: ? Rest. ? Placing cold or warm cloths (compresses) on the skin.  Hospital care, if the condition is very bad. Follow these instructions at home: Medicines  Take over-the-counter and prescription medicines only as told by your doctor.  If you were prescribed an antibiotic medicine, take it as told by your doctor. Do not stop taking it even if you start to feel better. General instructions  Drink enough fluid to keep your pee (urine) pale yellow.  Do not touch  or rub the infected area.  Raise (elevate) the infected area above the level of your heart while you are sitting or lying down.  Place cold or warm cloths on the area as told by your doctor.  Keep all follow-up visits as told by your doctor. This is important.   Contact a doctor if:  You have a fever.  You do not start to get better after 1-2 days of treatment.  Your bone or joint under the infected area starts to hurt after the skin has healed.  Your infection comes back. This can happen in the same area or another area.  You have a swollen bump in the area.  You have new symptoms.  You feel ill and have muscle aches and pains. Get help right away if:  Your symptoms get worse.  You feel very sleepy.  You throw up (vomit) or have watery poop (diarrhea) for a long time.  You see red streaks coming from the area.  Your red area gets larger.  Your red area turns dark in color. These symptoms may represent a serious problem that is an emergency. Do not wait to see if the symptoms will go away. Get medical help right away. Call your local emergency services (911 in the [place] Do not drive yourself to the hospital. Summary  Cellulitis is a skin infection. The area is often warm, red, swollen, and sore.  This condition is treated with medicines, rest, and cold and warm cloths.  Take all medicines   only as told by your doctor.  Tell your doctor if symptoms do not start to get better after 1-2 days of treatment. This information is not intended to replace advice given to you by your health care provider. Make sure you discuss any questions you have with your health care provider. Document Revised: 06/14/2017 Document Reviewed: 06/14/2017 Elsevier Patient Education  [place]. Cephalexin Tablets or Capsules What is this medicine? CEPHALEXIN (sef a LEX in) is a cephalosporin antibiotic. It treats some infections caused by bacteria. It will not work for colds, the flu,  or other viruses. This medicine may be used for other purposes; ask your health care provider or pharmacist if you have questions. COMMON BRAND NAME(S): Biocef, Daxbia, Keflex, Keftab What should I tell my health care provider before I take this medicine? They need to know if you have any of these conditions:  bleeding disorder  kidney disease  liver disease  seizures  stomach or intestine problems like colitis  an unusual or allergic reaction to cephalexin, other penicillin or cephalosporin antibiotics, other medicines, foods, dyes, or preservatives  pregnant or trying to get pregnant  breast-feeding How should I use this medicine? Take this drug by mouth. Take it as directed on the prescription label at the same time every day. You can take it with or without food. If it upsets your stomach, take it with food. Take all of this drug unless your health care provider tells you to stop it early. Keep taking it even if you think you are better. Talk to your health care provider about the use of this drug in children. While it may be prescribed for selected conditions, precautions do apply. Overdosage: If you think you have taken too much of this medicine contact a poison control center or emergency room at once. NOTE: This medicine is only for you. Do not share this medicine with others. What if I miss a dose? If you miss a dose, take it as soon as you can. If it is almost time for your next dose, take only that dose. Do not take double or extra doses. What may interact with this medicine?  probenecid  some other antibiotics This list may not describe all possible interactions. Give your health care provider a list of all the medicines, herbs, non-prescription drugs, or dietary supplements you use. Also tell them if you smoke, drink alcohol, or use illegal drugs. Some items may interact with your medicine. What should I watch for while using this medicine? Tell your health care  provider if your symptoms do not start to get better or if they get worse. Do not treat diarrhea with over the counter products. Contact your health care provider if you have diarrhea that lasts more than 2 days or if it is severe and watery. This medicine may cause serious skin reactions. They can happen weeks to months after starting the medicine. Contact your health care provider right away if you notice fevers or flu-like symptoms with a rash. The rash may be red or purple and then turn into blisters or peeling of the skin. Or, you might notice a red rash with swelling of the face, lips or lymph nodes in your neck or under your arms. If you have diabetes, you may get a false-positive result for sugar in your urine. Check with your health care provider. What side effects may I notice from receiving this medicine? Side effects that you should report to your doctor or health care provider  as soon as possible:  allergic reactions (skin rash, itching or hives; swelling of the face, lips, or tongue)  bloody or watery diarrhea  fever  kidney injury (trouble passing urine or change in the amount of urine)  low red blood cell counts (trouble breathing; feeling faint; lightheaded, falls; unusually weak or tired)  redness, blistering, peeling, or loosening of the skin, including inside the mouth  unusual bruising or bleeding Side effects that usually do not require medical attention (report to your doctor or health care provider if they continue or are bothersome):  headache  dizziness  nausea, vomiting  unusual vaginal discharge, itching, or odor  upset stomach This list may not describe all possible side effects. Call your doctor for medical advice about side effects. You may report side effects to FDA at 1-800-FDA-1088. Where should I keep my medicine? Keep out of the reach of children and pets. Store at room temperature between 20 and 25 degrees C (68 and 77 degrees F). Throw away any  unused drug after the expiration date. NOTE: This sheet is a summary. It may not cover all possible information. If you have questions about this medicine, talk to your doctor, pharmacist, or health care provider.  2021 Elsevier/Gold Standard (2018-11-28 15:26:31)

## 2020-07-14 NOTE — Progress Notes (Signed)
Acute Office Visit  Subjective:    Patient ID: Valerie Larson, female    DOB: 02/11/1955, 65 y.o.   MRN: [id number]  Chief Complaint  Patient presents with  . abcess at left collar bone    HPI Patient is in today for follow up from urgent care was seen at [facility] clinic Dr. Sherilyn Cooter on 07/03/20 for cellulitis of chest wall/ left collar bone. That started off as a small pimple and gradually increased in size with redness and pain.  She was prescribed Doxycycline 100 mg BID x 10 days.  She has completed Doxycycline.   She shows me pictures of the area as a pimple initially. She has finished the doxycycline yesterday and does note that it has improved some.  She has had some yellow drainage from center of abscess. She has been doing warm compresses TID.   Patient  denies any fever, body aches,chills, chest pain, shortness of breath, nausea, vomiting, or diarrhea.  Denies dizziness, lightheadedness, pre syncopal or syncopal episodes.     Past Medical History:  Diagnosis Date  . Chicken pox   . Emphysema of lung ([place])   . Hyperlipidemia   . Hypertension     Past Surgical History:  Procedure Laterality Date  . ABDOMINAL HYSTERECTOMY  2000   complete hysterectomy, no ovaries. Done for noncancerous reasons , ovarian cyst.   . APPENDECTOMY  1974  . CHOLECYSTECTOMY    . COLONOSCOPY WITH PROPOFOL N/A 05/22/2019   Procedure: COLONOSCOPY WITH PROPOFOL;  Surgeon: Jonathon Bellows, MD;  Location: [facility] ENDOSCOPY;  Service: Gastroenterology;  Laterality: N/A;  . DIAGNOSTIC LAPAROSCOPY    . GALLBLADDER SURGERY    . OVARIAN CYST SURGERY  2004  . TONSILLECTOMY    . TONSILLECTOMY      Family History  Problem Relation Age of Onset  . Cancer Mother   . Hyperlipidemia Mother   . Hypertension Mother   . Ovarian cancer Mother 80  . COPD Father   . Diabetes Father   . Hyperlipidemia Father   . Hypertension Sister   . Hypertension Brother     Social History   Socioeconomic History  .  Marital status: Married    Spouse name: Not on file  . Number of children: Not on file  . Years of education: Not on file  . Highest education level: Not on file  Occupational History  . Not on file  Tobacco Use  . Smoking status: Former Smoker    Quit date: 11/15/2019    Years since quitting: 0.6  . Smokeless tobacco: Never Used  Vaping Use  . Vaping Use: Never used  Substance and Sexual Activity  . Alcohol use: Never  . Drug use: Never  . Sexual activity: Not on file  Other Topics Concern  . Not on file  Social History Narrative   Grandson 3 , diagnosed with leukemia at 2. He is doing well, but she still worries about him.    Social Determinants of Health   Financial Resource Strain: Low Risk   . Difficulty of Paying Living Expenses: Not hard at all  Food Insecurity: No Food Insecurity  . Worried About Charity fundraiser in the Last Year: Never true  . Ran Out of Food in the Last Year: Never true  Transportation Needs: No Transportation Needs  . Lack of Transportation (Medical): No  . Lack of Transportation (Non-Medical): No  Physical Activity: Not on file  Stress: No Stress Concern Present  . Feeling of  Stress : Not at all  Social Connections: Unknown  . Frequency of Communication with Friends and Family: More than three times a week  . Frequency of Social Gatherings with Friends and Family: More than three times a week  . Attends Religious Services: Not on file  . Active Member of Clubs or Organizations: Not on file  . Attends Archivist Meetings: Not on file  . Marital Status: Not on file  Intimate Partner Violence: Not At Risk  . Fear of Current or Ex-Partner: No  . Emotionally Abused: No  . Physically Abused: No  . Sexually Abused: No    Outpatient Medications Prior to Visit  Medication Sig Dispense Refill  . albuterol (VENTOLIN HFA) 108 (90 Base) MCG/ACT inhaler TAKE 2 PUFFS BY MOUTH EVERY 6 HOURS AS NEEDED FOR WHEEZE OR SHORTNESS OF BREATH 18 g 3   . amLODipine (NORVASC) 5 MG tablet Take 1 tablet (5 mg total) by mouth daily. 90 tablet 3  . atorvastatin (LIPITOR) 20 MG tablet Take 1 tablet (20 mg total) by mouth daily. 90 tablet 3  . azelastine (ASTELIN) 0.1 % nasal spray SMARTSIG:2 Spray(s) Both Nares Every 12 Hours PRN    . cholecalciferol (VITAMIN D) 25 MCG (1000 UNIT) tablet Take 1,000 Units by mouth daily.    . hydrochlorothiazide (HYDRODIURIL) 25 MG tablet TAKE 1 TABLET BY MOUTH EVERY DAY 90 tablet 1  . loratadine (CLARITIN) 10 MG tablet Take 10 mg by mouth daily.    . Multiple Vitamins-Minerals (PRESERVISION AREDS 2+MULTI VIT PO) Take 1 tablet by mouth.    . potassium chloride (KLOR-CON) 20 MEQ packet Take 20 mEq by mouth daily. 90 packet 1  . Cholecalciferol 1.25 MG (50000 UT) TABS 50,000 units PO qwk for 8 weeks. (Patient not taking: Reported on 07/14/2020) 8 tablet 0  . Vitamin D, Ergocalciferol, (DRISDOL) 1.25 MG (50000 UNIT) CAPS capsule TAKE 1 CAPSULE BY MOUTH EVERY 1 WEEK FOR 8 WEEKS (Patient not taking: Reported on 07/14/2020) 4 capsule 1   No facility-administered medications prior to visit.    No Known Allergies  Review of Systems  Constitutional: Negative.   HENT: Negative.   Respiratory: Negative.   Cardiovascular: Negative.   Musculoskeletal: Negative.   Skin: Positive for color change and wound.  Psychiatric/Behavioral: Negative.        Objective:    Physical Exam Vitals reviewed.  Constitutional:      Appearance: Normal appearance.  Cardiovascular:     Rate and Rhythm: Normal rate and regular rhythm.     Pulses: Normal pulses.     Heart sounds: Normal heart sounds. No murmur heard. No friction rub. No gallop.   Pulmonary:     Effort: Pulmonary effort is normal. No respiratory distress.     Breath sounds: Normal breath sounds. No stridor. No wheezing, rhonchi or rales.  Chest:     Chest wall: No tenderness.  Abdominal:     Palpations: Abdomen is soft.  Musculoskeletal:        General: Normal range  of motion.  Skin:    General: Skin is warm.     Findings: Erythema present.       Neurological:     Mental Status: She is alert and oriented to person, place, and time.  Psychiatric:        Mood and Affect: Mood normal.        Behavior: Behavior normal.        Thought Content: Thought content normal.  Judgment: Judgment normal.     BP 118/82 (BP Location: Left Arm, Patient Position: Sitting, Cuff Size: Large)   Pulse 96   Temp (!) 96.4 F (35.8 C) (Temporal)   Ht 5\' 3"  (1.6 m)   Wt 176 lb (79.8 kg)   SpO2 99%   BMI 31.18 kg/m  Wt Readings from Last 3 Encounters:  07/14/20 176 lb (79.8 kg)  04/21/20 174 lb 3.2 oz (79 kg)  04/15/20 165 lb (74.8 kg)    Health Maintenance Due  Topic Date Due  . Zoster Vaccines- Shingrix (1 of 2) Never done  . PNA vac Low Risk Adult (1 of 2 - PCV13) 03/09/2018    There are no preventive care reminders to display for this patient.   Lab Results  Component Value Date   TSH 3.58 05/05/2020   Lab Results  Component Value Date   WBC 6.6 05/05/2020   HGB 14.9 05/05/2020   HCT 43.8 05/05/2020   MCV 91.8 05/05/2020   PLT 311.0 05/05/2020   Lab Results  Component Value Date   NA 141 07/01/2020   K 4.0 07/01/2020   CO2 32 07/01/2020   GLUCOSE 101 (H) 07/01/2020   BUN 12 07/01/2020   CREATININE 0.96 07/01/2020   BILITOT 1.4 (H) 07/01/2020   ALKPHOS 69 07/01/2020   AST 15 07/01/2020   ALT 9 07/01/2020   PROT 6.6 07/01/2020   ALBUMIN 3.8 07/01/2020   CALCIUM 10.0 07/01/2020   GFR 61.31 07/01/2020   Lab Results  Component Value Date   CHOL 152 05/05/2020   Lab Results  Component Value Date   HDL 34.40 (L) 05/05/2020   Lab Results  Component Value Date   LDLCALC 78 05/05/2020   Lab Results  Component Value Date   TRIG 194.0 (H) 05/05/2020   Lab Results  Component Value Date   CHOLHDL 4 05/05/2020   Lab Results  Component Value Date   HGBA1C 5.8 05/05/2020       Assessment & Plan:   Problem List Items  Addressed This Visit      Musculoskeletal and Integument   Cutaneous abscess of chest wall - Primary   Relevant Orders   Ambulatory referral to Dermatology     Other   Cellulitis of chest wall   Relevant Medications   cephALEXin (KEFLEX) 250 MG/5ML suspension   Other Relevant Orders   CBC with Differential/Platelet     Orders Placed This Encounter  Procedures  . CBC with Differential/Platelet  . Ambulatory referral to Dermatology    Referral Priority:   Urgent    Referral Type:   Consultation    Referral Reason:   Specialty Services Required    Referred to Provider:   Jannet Mantis, MD    Requested Specialty:   Dermatology    Number of Visits Requested:   1  For I & D   Meds ordered this encounter  Medications  . cephALEXin (KEFLEX) 250 MG/5ML suspension    Sig: Take 10 mLs (500 mg total) by mouth 4 (four) times daily for 10 days.    Dispense:  400 mL    Refill:  0    Red Flags discussed. The patient was given clear instructions to go to ER or return to medical center if any red flags develop, symptoms do not improve, worsen or new problems develop. They verbalized understanding.  Return in about 3 days (around 07/17/2020), or if symptoms worsen or fail to improve, for at any time for  any worsening symptoms, Go to Emergency room/ urgent care if worse.  Marcille Buffy, FNP

## 2020-07-18 ENCOUNTER — Other Ambulatory Visit: Payer: Self-pay | Admitting: Family

## 2020-07-19 ENCOUNTER — Ambulatory Visit: Payer: Medicare Other | Admitting: Adult Health

## 2020-07-19 DIAGNOSIS — Z0289 Encounter for other administrative examinations: Secondary | ICD-10-CM

## 2020-07-20 DIAGNOSIS — R52 Pain, unspecified: Secondary | ICD-10-CM | POA: Diagnosis not present

## 2020-07-20 DIAGNOSIS — L910 Hypertrophic scar: Secondary | ICD-10-CM | POA: Diagnosis not present

## 2020-07-20 DIAGNOSIS — L720 Epidermal cyst: Secondary | ICD-10-CM | POA: Diagnosis not present

## 2020-07-30 ENCOUNTER — Other Ambulatory Visit (INDEPENDENT_AMBULATORY_CARE_PROVIDER_SITE_OTHER): Payer: Medicare Other

## 2020-07-30 ENCOUNTER — Other Ambulatory Visit: Payer: Self-pay

## 2020-07-30 DIAGNOSIS — R17 Unspecified jaundice: Secondary | ICD-10-CM | POA: Diagnosis not present

## 2020-07-30 LAB — GAMMA GT: GGT: 30 U/L (ref 7–51)

## 2020-07-31 LAB — BILIRUBIN, FRACTIONATED(TOT/DIR/INDIR)
Bilirubin, Direct: 0.2 mg/dL (ref 0.0–0.2)
Indirect Bilirubin: 0.9 mg/dL (calc) (ref 0.2–1.2)
Total Bilirubin: 1.1 mg/dL (ref 0.2–1.2)

## 2020-08-05 ENCOUNTER — Other Ambulatory Visit: Payer: Self-pay | Admitting: Family

## 2020-08-16 ENCOUNTER — Encounter: Payer: Self-pay | Admitting: Family

## 2020-08-16 ENCOUNTER — Other Ambulatory Visit: Payer: Self-pay

## 2020-08-16 ENCOUNTER — Ambulatory Visit (INDEPENDENT_AMBULATORY_CARE_PROVIDER_SITE_OTHER): Payer: Medicare Other | Admitting: Family

## 2020-08-16 VITALS — BP 136/90 | HR 68 | Temp 98.5°F | Ht 63.0 in | Wt 175.6 lb

## 2020-08-16 DIAGNOSIS — I10 Essential (primary) hypertension: Secondary | ICD-10-CM | POA: Diagnosis not present

## 2020-08-16 DIAGNOSIS — E785 Hyperlipidemia, unspecified: Secondary | ICD-10-CM | POA: Diagnosis not present

## 2020-08-16 MED ORDER — AMLODIPINE-ATORVASTATIN 5-20 MG PO TABS: 1.0000 | ORAL_TABLET | Freq: Every evening | ORAL | 3 refills | Status: DC

## 2020-08-16 NOTE — Patient Instructions (Addendum)
Please ask pharmacy about Shingrex vaccine for shingles .   STOP amlodipine 5mg  and atorvastatin 20mg   I have sent in combination pill Caduet which includes both amlodipine 5mg  and atorvastatin 20mg    Please call  and schedule your 3D mammogram  as discussed.   [place]  [place], [place]   Nice to see you!

## 2020-08-16 NOTE — Progress Notes (Signed)
Subjective:    Patient ID: Valerie Larson, female    DOB: December 20, 1955, 65 y.o.   MRN: [id number]  CC: Valerie Larson is a 65 y.o. female who presents today for follow up.   HPI: Feels well  No new complaints  HTN- compliant with amlodipine 5mg , hctz 25mg  with potassium chloride 76meq qod, which she hasnt taken yet today. No cp, sob  She would prefer to be back on combination pill, Caduet to save on so many pills.   HLD- compliant with lipitor 20mg   Vitamin D - compliant with vitamin d 10000 daily  Mammogram due next month; she prefers to schedule herself.     HISTORY:  Past Medical History:  Diagnosis Date   Chicken pox    Emphysema of lung ([place])    Hyperlipidemia    Hypertension    Past Surgical History:  Procedure Laterality Date   ABDOMINAL HYSTERECTOMY  2000   complete hysterectomy, no ovaries. Done for noncancerous reasons , ovarian cyst.    APPENDECTOMY  1974   CHOLECYSTECTOMY     COLONOSCOPY WITH PROPOFOL N/A 05/22/2019   Procedure: COLONOSCOPY WITH PROPOFOL;  Surgeon: Jonathon Bellows, MD;  Location: [facility] ENDOSCOPY;  Service: Gastroenterology;  Laterality: N/A;   DIAGNOSTIC LAPAROSCOPY     GALLBLADDER SURGERY     OVARIAN CYST SURGERY  2004   TONSILLECTOMY     TONSILLECTOMY     Family History  Problem Relation Age of Onset   Cancer Mother    Hyperlipidemia Mother    Hypertension Mother    Ovarian cancer Mother 40   COPD Father    Diabetes Father    Hyperlipidemia Father    Hypertension Sister    Hypertension Brother     Allergies: Patient has no known allergies. Current Outpatient Medications on File Prior to Visit  Medication Sig Dispense Refill   albuterol (VENTOLIN HFA) 108 (90 Base) MCG/ACT inhaler TAKE 2 PUFFS BY MOUTH EVERY 6 HOURS AS NEEDED FOR WHEEZE OR SHORTNESS OF BREATH 18 g 3   azelastine (ASTELIN) 0.1 % nasal spray SMARTSIG:2 Spray(s) Both Nares Every 12 Hours PRN     cholecalciferol (VITAMIN D) 25 MCG (1000 UNIT) tablet Take 1,000 Units by  mouth daily.     hydrochlorothiazide (HYDRODIURIL) 25 MG tablet TAKE 1 TABLET BY MOUTH EVERY DAY 90 tablet 1   loratadine (CLARITIN) 10 MG tablet Take 10 mg by mouth daily.     Multiple Vitamins-Minerals (PRESERVISION AREDS 2+MULTI VIT PO) Take 1 tablet by mouth.     potassium chloride (KLOR-CON) 20 MEQ packet Take 20 mEq by mouth daily. 90 packet 1   No current facility-administered medications on file prior to visit.    Social History   Tobacco Use   Smoking status: Former    Pack years: 0.00    Types: Cigarettes    Quit date: 11/15/2019    Years since quitting: 0.7   Smokeless tobacco: Never  Vaping Use   Vaping Use: Never used  Substance Use Topics   Alcohol use: Never   Drug use: Never    Review of Systems    Objective:    BP 136/90 (BP Location: Left Arm, Patient Position: Sitting, Cuff Size: Large)   Pulse 68   Temp 98.5 F (36.9 C) (Oral)   Ht 5\' 3"  (1.6 m)   Wt 175 lb 9.6 oz (79.7 kg)   SpO2 97%   BMI 31.11 kg/m  BP Readings from Last 3 Encounters:  08/16/20 136/90  07/14/20 118/82  04/21/20 120/78   Wt Readings from Last 3 Encounters:  08/16/20 175 lb 9.6 oz (79.7 kg)  07/14/20 176 lb (79.8 kg)  04/21/20 174 lb 3.2 oz (79 kg)    Physical Exam     Assessment & Plan:   Problem List Items Addressed This Visit       Cardiovascular and Mediastinum   Essential hypertension - Primary    Historically controlled. She hasnt taken BP medications yet today. Will continue amlodipine 5mg  ( now sent in with combination with atorvastatin 20mg ) , hctz 25mg  with potassium chloride 64meq qod       Relevant Medications   amLODipine-atorvastatin (CADUET) 5-20 MG tablet     Other   Hyperlipidemia    Controlled. LDL< 100. Continue lipitor 20mg  ( now sent in with combination with amlodipine 5mg ).       Relevant Medications   amLODipine-atorvastatin (CADUET) 5-20 MG tablet     I have discontinued [name] [facility] [place]'s atorvastatin, amLODipine, and Vitamin D  (Ergocalciferol). I am also having her start on amLODipine-atorvastatin. Additionally, I am having her maintain her loratadine, azelastine, Multiple Vitamins-Minerals (PRESERVISION AREDS 2+MULTI VIT PO), albuterol, potassium chloride, cholecalciferol, and hydrochlorothiazide.   Meds ordered this encounter  Medications   amLODipine-atorvastatin (CADUET) 5-20 MG tablet    Sig: Take 1 tablet by mouth every evening.    Dispense:  90 tablet    Refill:  3    Order Specific Question:   Supervising Provider    Answer:   Crecencio Mc [[id number]]    Return precautions given.   Risks, benefits, and alternatives of the medications and treatment plan prescribed today were discussed, and patient expressed understanding.   Education regarding symptom management and diagnosis given to patient on AVS.  Continue to follow with Burnard Hawthorne, FNP for routine health maintenance.   [place] and I agreed with plan.   Mable Paris, FNP

## 2020-08-16 NOTE — Assessment & Plan Note (Addendum)
Controlled. LDL< 100. Continue lipitor 20mg  ( now sent in with combination with amlodipine 5mg ).

## 2020-08-16 NOTE — Assessment & Plan Note (Addendum)
Historically controlled. She hasnt taken BP medications yet today. Will continue amlodipine 5mg  ( now sent in with combination with atorvastatin 20mg ) , hctz 25mg  with potassium chloride 73meq qod

## 2020-09-16 ENCOUNTER — Other Ambulatory Visit: Payer: Self-pay | Admitting: Family

## 2020-09-16 DIAGNOSIS — Z1231 Encounter for screening mammogram for malignant neoplasm of breast: Secondary | ICD-10-CM

## 2020-09-21 DIAGNOSIS — L720 Epidermal cyst: Secondary | ICD-10-CM | POA: Diagnosis not present

## 2020-09-21 DIAGNOSIS — L910 Hypertrophic scar: Secondary | ICD-10-CM | POA: Diagnosis not present

## 2020-10-07 ENCOUNTER — Ambulatory Visit
Admission: RE | Admit: 2020-10-07 | Discharge: 2020-10-07 | Disposition: A | Discharge status: Home / Self Care | Payer: Medicare Other | Source: Ambulatory Visit | Attending: Family | Admitting: Family

## 2020-10-07 ENCOUNTER — Other Ambulatory Visit: Payer: Self-pay

## 2020-10-07 DIAGNOSIS — Z1231 Encounter for screening mammogram for malignant neoplasm of breast: Secondary | ICD-10-CM | POA: Diagnosis not present

## 2020-10-19 ENCOUNTER — Encounter: Payer: Self-pay | Admitting: Family

## 2020-10-19 ENCOUNTER — Ambulatory Visit (INDEPENDENT_AMBULATORY_CARE_PROVIDER_SITE_OTHER): Payer: Medicare Other | Admitting: Family

## 2020-10-19 ENCOUNTER — Other Ambulatory Visit (HOSPITAL_COMMUNITY)
Admission: RE | Admit: 2020-10-19 | Discharge: 2020-10-19 | Disposition: A | Discharge status: Home / Self Care | Payer: Medicare Other | Source: Ambulatory Visit | Attending: Family | Admitting: Family

## 2020-10-19 ENCOUNTER — Other Ambulatory Visit: Payer: Self-pay

## 2020-10-19 VITALS — BP 118/90 | HR 92 | Temp 98.4°F | Ht 63.0 in | Wt 172.8 lb

## 2020-10-19 DIAGNOSIS — E785 Hyperlipidemia, unspecified: Secondary | ICD-10-CM | POA: Diagnosis not present

## 2020-10-19 DIAGNOSIS — R799 Abnormal finding of blood chemistry, unspecified: Secondary | ICD-10-CM | POA: Diagnosis not present

## 2020-10-19 DIAGNOSIS — E559 Vitamin D deficiency, unspecified: Secondary | ICD-10-CM | POA: Diagnosis not present

## 2020-10-19 DIAGNOSIS — Z23 Encounter for immunization: Secondary | ICD-10-CM

## 2020-10-19 DIAGNOSIS — Z01419 Encounter for gynecological examination (general) (routine) without abnormal findings: Secondary | ICD-10-CM | POA: Insufficient documentation

## 2020-10-19 DIAGNOSIS — Z1151 Encounter for screening for human papillomavirus (HPV): Secondary | ICD-10-CM | POA: Insufficient documentation

## 2020-10-19 DIAGNOSIS — R7303 Prediabetes: Secondary | ICD-10-CM | POA: Diagnosis not present

## 2020-10-19 DIAGNOSIS — I10 Essential (primary) hypertension: Secondary | ICD-10-CM

## 2020-10-19 DIAGNOSIS — Z0000 Encounter for general adult medical examination without abnormal findings: Secondary | ICD-10-CM

## 2020-10-19 DIAGNOSIS — D582 Other hemoglobinopathies: Secondary | ICD-10-CM | POA: Diagnosis not present

## 2020-10-19 LAB — CBC WITH DIFFERENTIAL/PLATELET
Basophils Absolute: 0 10*3/uL (ref 0.0–0.1)
Basophils Relative: 0.7 % (ref 0.0–3.0)
Eosinophils Absolute: 0.1 10*3/uL (ref 0.0–0.7)
Eosinophils Relative: 2.3 % (ref 0.0–5.0)
HCT: 44.3 % (ref 36.0–46.0)
Hemoglobin: 14.9 g/dL (ref 12.0–15.0)
Lymphocytes Relative: 33.4 % (ref 12.0–46.0)
Lymphs Abs: 2 10*3/uL (ref 0.7–4.0)
MCHC: 33.7 g/dL (ref 30.0–36.0)
MCV: 92.2 fl (ref 78.0–100.0)
Monocytes Absolute: 0.6 10*3/uL (ref 0.1–1.0)
Monocytes Relative: 9.9 % (ref 3.0–12.0)
Neutro Abs: 3.2 10*3/uL (ref 1.4–7.7)
Neutrophils Relative %: 53.7 % (ref 43.0–77.0)
Platelets: 335 10*3/uL (ref 150.0–400.0)
RBC: 4.8 Mil/uL (ref 3.87–5.11)
RDW: 13.4 % (ref 11.5–15.5)
WBC: 6 10*3/uL (ref 4.0–10.5)

## 2020-10-19 LAB — HEMOGLOBIN A1C: Hgb A1c MFr Bld: 6 % (ref 4.6–6.5)

## 2020-10-19 LAB — VITAMIN D 25 HYDROXY (VIT D DEFICIENCY, FRACTURES): VITD: 21.56 ng/mL — ABNORMAL LOW (ref 30.00–100.00)

## 2020-10-19 LAB — COMPREHENSIVE METABOLIC PANEL
ALT: 12 U/L (ref 0–35)
AST: 21 U/L (ref 0–37)
Albumin: 3.8 g/dL (ref 3.5–5.2)
Alkaline Phosphatase: 69 U/L (ref 39–117)
BUN: 9 mg/dL (ref 6–23)
CO2: 34 mEq/L — ABNORMAL HIGH (ref 19–32)
Calcium: 9.8 mg/dL (ref 8.4–10.5)
Chloride: 101 mEq/L (ref 96–112)
Creatinine, Ser: 0.95 mg/dL (ref 0.40–1.20)
GFR: 61.95 mL/min (ref >60.00)
Glucose, Bld: 111 mg/dL — ABNORMAL HIGH (ref 70–99)
Potassium: 3.6 mEq/L (ref 3.5–5.1)
Sodium: 141 mEq/L (ref 135–145)
Total Bilirubin: 0.9 mg/dL (ref 0.2–1.2)
Total Protein: 6.7 g/dL (ref 6.0–8.3)

## 2020-10-19 NOTE — Assessment & Plan Note (Signed)
Slightly elevated today.  Patient has a blood pressure at that time we jointly agreed that she would monitor her blood pressure at home and call me if readings were consistently greater than 120/80.  Counseled her on starting an exercise program and low-sodium diet.  Continue amlodipine 5 mg, hydrochlorothiazide 25 mg, potassium chloride 20 meq qod

## 2020-10-19 NOTE — Assessment & Plan Note (Signed)
Controlled, continue Lipitor 20 mg

## 2020-10-19 NOTE — Addendum Note (Signed)
Addended by: Cheri Rous E on: 10/19/2020 11:31 AM   Modules accepted: Orders

## 2020-10-19 NOTE — Assessment & Plan Note (Addendum)
Mammogram is up-to-date and patient politely declines clinical breast exam in the office today.  Advised to continue self breast exam.  Counseled on the importance of exercise and positive walking program.Abbreviated screening labs today based on abnormalities 04/2020.  Pelvic exam performed to evaluate for cervix.  Patient does not have cervix.  She would no longer require cervical cancer screening and I have noted in her chart. Given PCV20.  Advised that she meets criteria for [place] lung cancer screening program.  Patient like to consider this test will call her insurance first.  She will let me know if she would like to proceed.

## 2020-10-19 NOTE — Progress Notes (Signed)
Subjective:    Patient ID: Valerie Larson, female    DOB: 02/22/1955, 65 y.o.   MRN: [id number]  CC: Valerie Larson is a 65 y.o. female who presents today for follow up and annual physical exam.    HPI: HPI Feels well today. No complaints.   HTN- compliant with amlodipine '5mg'$  (  combination with atorvastatin '20mg'$ ) , hctz '25mg'$  with potassium chloride 51mq qod. No cp, sob. She is not routinely checking blood pressure at home however has a BP  machine.    HLD- compliant with lipitor '20mg'$   History of vitamin D deficiency  Colorectal Cancer Screening: UTD , Dr AVicente Males repeat in 5 years.  Breast Cancer Screening: Mammogram UTD Cervical Cancer Screening: History of hysterectomy for noncancerous reasons.  She does not have her ovaries. Bone Health screening/DEXA for 65+: UTD Lung Cancer Screening: former smoker        Tetanus - UTD        Pneumococcal - Candidate for PCV 20   Exercise: No regular exercise.   Alcohol use:  none Smoking/tobacco use: former smoker.     HISTORY:  Past Medical History:  Diagnosis Date   Chicken pox    [place] of lung [place]    Hyperlipidemia    Hypertension     Past Surgical History:  Procedure Laterality Date   ABDOMINAL HYSTERECTOMY  2000   complete hysterectomy, no ovaries. Done for noncancerous reasons , ovarian cyst.    APPENDECTOMY  1974   CHOLECYSTECTOMY     COLONOSCOPY WITH PROPOFOL N/A 05/22/2019   Procedure: COLONOSCOPY WITH PROPOFOL;  Surgeon: AJonathon Bellows MD;  [facility]  Service: Gastroenterology;  Laterality: N/A;   DIAGNOSTIC LAPAROSCOPY     GALLBLADDER SURGERY     OVARIAN CYST SURGERY  2004   TONSILLECTOMY     TONSILLECTOMY     Family History  Problem Relation Age of Onset   Cancer Mother    Hyperlipidemia Mother    Hypertension Mother    Ovarian cancer Mother 627  COPD Father    Diabetes Father    Hyperlipidemia Father    Hypertension Sister    Hypertension Brother       ALLERGIES: Patient has no  known allergies.  Current Outpatient Medications on File Prior to Visit  Medication Sig Dispense Refill   albuterol (VENTOLIN HFA) 108 (90 Base) MCG/ACT inhaler TAKE 2 PUFFS BY MOUTH EVERY 6 HOURS AS NEEDED FOR WHEEZE OR SHORTNESS OF BREATH 18 g 3   amLODipine-atorvastatin (CADUET) 5-20 MG tablet Take 1 tablet by mouth every evening. 90 tablet 3   azelastine (ASTELIN) 0.1 % nasal spray SMARTSIG:2 Spray(s) Both Nares Every 12 Hours PRN     cholecalciferol (VITAMIN D) 25 MCG (1000 UNIT) tablet Take 1,000 Units by mouth daily.     hydrochlorothiazide (HYDRODIURIL) 25 MG tablet TAKE 1 TABLET BY MOUTH EVERY DAY 90 tablet 1   loratadine (CLARITIN) 10 MG tablet Take 10 mg by mouth daily.     Multiple Vitamins-Minerals (PRESERVISION AREDS 2+MULTI VIT PO) Take 1 tablet by mouth.     potassium chloride (KLOR-CON) 20 MEQ packet Take 20 mEq by mouth daily. 90 packet 1   No current facility-administered medications on file prior to visit.    Social History   Tobacco Use   Smoking status: Former    Types: Cigarettes    Quit date: 11/15/2019    Years since quitting: 0.9   Smokeless tobacco: Never  Vaping Use  Vaping Use: Never used  Substance Use Topics   Alcohol use: Never   Drug use: Never    Review of Systems    Objective:    BP 118/90 (BP Location: Left Arm, Patient Position: Sitting, Cuff Size: Normal)   Pulse 92   Temp 98.4 F (36.9 C) (Oral)   Ht '5\' 3"'$  (1.6 m)   Wt 172 lb 12.8 oz (78.4 kg)   SpO2 96%   BMI 30.61 kg/m   BP Readings from Last 3 Encounters:  10/19/20 118/90  08/16/20 136/90  07/14/20 118/82   Wt Readings from Last 3 Encounters:  10/19/20 172 lb 12.8 oz (78.4 kg)  08/16/20 175 lb 9.6 oz (79.7 kg)  07/14/20 176 lb (79.8 kg)    Physical Exam Vitals reviewed.  Constitutional:      Appearance: She is well-developed.  Eyes:     Conjunctiva/sclera: Conjunctivae normal.  Neck:     Thyroid: No thyroid mass or thyromegaly.  Cardiovascular:     Rate and  Rhythm: Normal rate and regular rhythm.     Pulses: Normal pulses.     Heart sounds: Normal heart sounds.  Pulmonary:     Effort: Pulmonary effort is normal.     Breath sounds: Normal breath sounds. No wheezing, rhonchi or rales.  Chest:     Comments: Patient declines CBE as she had her mammogram last week.  Genitourinary:    Vagina: No signs of injury and foreign body. No vaginal discharge, erythema, tenderness or bleeding.     Uterus: Absent.      Adnexa:        Right: No tenderness.         Left: No tenderness.       Rectum: Normal. No external hemorrhoid.     Comments: Normal hair distribution. No lesions seen over suprapubic area. No masses, tenderness of urethra.  Pap performed of vaginal canal. No cervix.  Lymphadenopathy:     Head:     Right side of head: No submental, submandibular, tonsillar, preauricular, posterior auricular or occipital adenopathy.     Left side of head: No submental, submandibular, tonsillar, preauricular, posterior auricular or occipital adenopathy.     Cervical:     Right cervical: No superficial, deep or posterior cervical adenopathy.    Left cervical: No superficial, deep or posterior cervical adenopathy.     Upper Body:     Right upper body: No pectoral adenopathy.     Left upper body: No pectoral adenopathy.  Skin:    General: Skin is warm and dry.  Neurological:     Mental Status: She is alert.  Psychiatric:        Speech: Speech normal.        Behavior: Behavior normal.        Thought Content: Thought content normal.       Assessment & Plan:   Problem List Items Addressed This Visit   None Visit Diagnoses     Routine health maintenance    -  Primary   Relevant Orders   Cytology - PAP( [place])   Need for immunization against influenza       Relevant Orders   Flu Vaccine QUAD High Dose(Fluad) (Completed)        I am having Hollis L. [place] maintain her loratadine, azelastine, Multiple Vitamins-Minerals (PRESERVISION AREDS  2+MULTI VIT PO), albuterol, potassium chloride, cholecalciferol, hydrochlorothiazide, and amLODipine-atorvastatin.   No orders of the defined types were placed in this encounter.  Return precautions given.   Risks, benefits, and alternatives of the medications and treatment plan prescribed today were discussed, and patient expressed understanding.   Education regarding symptom management and diagnosis given to patient on AVS.   Continue to follow with Burnard Hawthorne, FNP for routine health maintenance.   [place] and I agreed with plan.   Mable Paris, FNP

## 2020-10-19 NOTE — Patient Instructions (Addendum)
It is imperative that you are seen AT least twice per year for labs and monitoring. Monitor blood pressure at home and me 5-6 reading on separate days. Goal is less than 120/80, based on newest guidelines, however we certainly want to be less than 130/80;  if persistently higher, please make sooner follow up appointment so we can recheck you blood pressure and manage/ adjust medications.  Let me know if you would like to do the  Annual Lung cancer screen  With CT Chest. This is an annual screening  test for lung cancer.  Health Maintenance for Postmenopausal Women Menopause is a normal process in which your ability to get pregnant comes to an end. This process happens slowly over many months or years, usually between the ages of 32 and 45. Menopause is complete when you have missed your menstrual periods for 12 months. It is important to talk with your health care provider about some of the most common conditions that affect women after menopause (postmenopausal women). These include heart disease, cancer, and bone loss (osteoporosis). Adopting a healthy lifestyle and getting preventive care can help to promote your health and wellness. The actions you take can also lower your chances of developing some of these common conditions. What should I know about menopause? During menopause, you may get a number of symptoms, such as: Hot flashes. These can be moderate or severe. Night sweats. Decrease in sex drive. Mood swings. Headaches. Tiredness. Irritability. Memory problems. Insomnia. Choosing to treat or not to treat these symptoms is a decision that you make with your health care provider. Do I need hormone replacement therapy? Hormone replacement therapy is effective in treating symptoms that are caused by menopause, such as hot flashes and night sweats. Hormone replacement carries certain risks, especially as you become older. If you are thinking about using estrogen or estrogen with progestin,  discuss the benefits and risks with your health care provider. What is my risk for heart disease and stroke? The risk of heart disease, heart attack, and stroke increases as you age. One of the causes may be a change in the body's hormones during menopause. This can affect how your body uses dietary fats, triglycerides, and cholesterol. Heart attack and stroke are medical emergencies. There are many things that you can do to help prevent heart disease and stroke. Watch your blood pressure High blood pressure causes heart disease and increases the risk of stroke. This is more likely to develop in people who have high blood pressure readings, are of African descent, or are overweight. Have your blood pressure checked: Every 3-5 years if you are 25-71 years of age. Every year if you are 28 years old or older. Eat a healthy diet  Eat a diet that includes plenty of vegetables, fruits, low-fat dairy products, and lean protein. Do not eat a lot of foods that are high in solid fats, added sugars, or sodium. Get regular exercise Get regular exercise. This is one of the most important things you can do for your health. Most adults should: Try to exercise for at least 150 minutes each week. The exercise should increase your heart rate and make you sweat (moderate-intensity exercise). Try to do strengthening exercises at least twice each week. Do these in addition to the moderate-intensity exercise. Spend less time sitting. Even light physical activity can be beneficial. Other tips Work with your health care provider to achieve or maintain a healthy weight. Do not use any products that contain nicotine or tobacco,  such as cigarettes, e-cigarettes, and chewing tobacco. If you need help quitting, ask your health care provider. Know your numbers. Ask your health care provider to check your cholesterol and your blood sugar (glucose). Continue to have your blood tested as directed by your health care  provider. Do I need screening for cancer? Depending on your health history and family history, you may need to have cancer screening at different stages of your life. This may include screening for: Breast cancer. Cervical cancer. Lung cancer. Colorectal cancer. What is my risk for osteoporosis? After menopause, you may be at increased risk for osteoporosis. Osteoporosis is a condition in which bone destruction happens more quickly than new bone creation. To help prevent osteoporosis or the bone fractures that can happen because of osteoporosis, you may take the following actions: If you are 69-62 years old, get at least 1,000 mg of calcium and at least 600 mg of vitamin D per day. If you are older than age 49 but younger than age 51, get at least 1,200 mg of calcium and at least 600 mg of vitamin D per day. If you are older than age 6, get at least 1,200 mg of calcium and at least 800 mg of vitamin D per day. Smoking and drinking excessive alcohol increase the risk of osteoporosis. Eat foods that are rich in calcium and vitamin D, and do weight-bearing exercises several times each week as directed by your health care provider. How does menopause affect my mental health? Depression may occur at any age, but it is more common as you become older. Common symptoms of depression include: Low or sad mood. Changes in sleep patterns. Changes in appetite or eating patterns. Feeling an overall lack of motivation or enjoyment of activities that you previously enjoyed. Frequent crying spells. Talk with your health care provider if you think that you are experiencing depression. General instructions See your health care provider for regular wellness exams and vaccines. This may include: Scheduling regular health, dental, and eye exams. Getting and maintaining your vaccines. These include: Influenza vaccine. Get this vaccine each year before the flu season begins. Pneumonia vaccine. Shingles  vaccine. Tetanus, diphtheria, and pertussis (Tdap) booster vaccine. Your health care provider may also recommend other immunizations. Tell your health care provider if you have ever been abused or do not feel safe at home. Summary Menopause is a normal process in which your ability to get pregnant comes to an end. This condition causes hot flashes, night sweats, decreased interest in sex, mood swings, headaches, or lack of sleep. Treatment for this condition may include hormone replacement therapy. Take actions to keep yourself healthy, including exercising regularly, eating a healthy diet, watching your weight, and checking your blood pressure and blood sugar levels. Get screened for cancer and depression. Make sure that you are up to date with all your vaccines. This information is not intended to replace advice given to you by your health care provider. Make sure you discuss any questions you have with your health care provider. Document Revised: 01/16/2018 Document Reviewed: 01/16/2018 Elsevier Patient Education  2022 [employer].

## 2020-10-20 NOTE — Addendum Note (Signed)
Addended by: Burnard Hawthorne on: 10/20/2020 01:34 PM   Modules accepted: Level of Service

## 2020-10-21 LAB — CYTOLOGY - PAP
Adequacy: ABSENT
Comment: NEGATIVE
Diagnosis: NEGATIVE
High risk HPV: NEGATIVE

## 2020-10-26 ENCOUNTER — Other Ambulatory Visit: Payer: Self-pay | Admitting: Family

## 2020-10-26 DIAGNOSIS — E559 Vitamin D deficiency, unspecified: Secondary | ICD-10-CM

## 2020-10-26 MED ORDER — CHOLECALCIFEROL 1.25 MG (50000 UT) PO TABS: ORAL_TABLET | ORAL | 0 refills | Status: DC

## 2020-11-01 ENCOUNTER — Other Ambulatory Visit: Payer: Self-pay | Admitting: Family

## 2020-11-28 ENCOUNTER — Other Ambulatory Visit: Payer: Self-pay | Admitting: Family

## 2020-11-28 DIAGNOSIS — E559 Vitamin D deficiency, unspecified: Secondary | ICD-10-CM

## 2021-01-18 ENCOUNTER — Ambulatory Visit: Payer: Medicare Other | Admitting: Family

## 2021-02-17 DIAGNOSIS — H2513 Age-related nuclear cataract, bilateral: Secondary | ICD-10-CM | POA: Diagnosis not present

## 2021-03-08 ENCOUNTER — Ambulatory Visit (INDEPENDENT_AMBULATORY_CARE_PROVIDER_SITE_OTHER): Payer: Medicare Other | Admitting: Family

## 2021-03-08 ENCOUNTER — Other Ambulatory Visit: Payer: Self-pay

## 2021-03-08 ENCOUNTER — Encounter: Payer: Self-pay | Admitting: Family

## 2021-03-08 VITALS — BP 124/84 | HR 83 | Temp 98.5°F | Ht 63.0 in | Wt 168.2 lb

## 2021-03-08 DIAGNOSIS — L299 Pruritus, unspecified: Secondary | ICD-10-CM | POA: Diagnosis not present

## 2021-03-08 DIAGNOSIS — E559 Vitamin D deficiency, unspecified: Secondary | ICD-10-CM | POA: Insufficient documentation

## 2021-03-08 DIAGNOSIS — I10 Essential (primary) hypertension: Secondary | ICD-10-CM | POA: Diagnosis not present

## 2021-03-08 DIAGNOSIS — Z716 Tobacco abuse counseling: Secondary | ICD-10-CM

## 2021-03-08 MED ORDER — CHOLECALCIFEROL 1.25 MG (50000 UT) PO TABS: ORAL_TABLET | ORAL | 0 refills | Status: DC

## 2021-03-08 MED ORDER — AZELASTINE HCL 0.1 % NA SOLN: NASAL | 1 refills | Status: DC

## 2021-03-08 MED ORDER — MOMETASONE FUROATE 0.1 % EX CREA: 1.0000 "application " | TOPICAL_CREAM | Freq: Every day | CUTANEOUS | 1 refills | Status: DC

## 2021-03-08 NOTE — Assessment & Plan Note (Signed)
She declines CT lung cancer screening at this time. Advised discussion of counseling, how to wear nicotine patch with Catie Travis, pharmD. She declines at this time.

## 2021-03-08 NOTE — Assessment & Plan Note (Signed)
Hold vitamin D 1000 units daily and start prescription vitamin D.  Repeat vitamin D at follow up

## 2021-03-08 NOTE — Patient Instructions (Addendum)
Stop vitamin D 1000 units until after mega dose vitamin D below.   Your vitamin D level is low.  You may start prescription for vitamin  D 50000 units by mouth ONCE weekly for 8 weeks only. I have sent this to your pharmacy. After 8 weeks, you MAY stop this dose and resume over the counter cholecalciferol 800 units daily. Please call our office for a follow up visit and we can recheck level in a 3-4 months   Trial of elocan for ear itching.  Let me know if does not resolve.  Please continue to consider CT lung cancer screening . If you like to further discuss smoking cessation, I highly recommend our pharmacist here, Catie Darnelle Maffucci, as a resource.     Nice to see you!

## 2021-03-08 NOTE — Progress Notes (Signed)
Subjective:    Patient ID: Valerie Larson, female    DOB: 16-Jun-1955, 66 y.o.   MRN: [id number]  CC: Valerie Larson is a 66 y.o. female who presents today for follow up.   HPI: Overall feels well today.  She is walking more and working on weight loss.   complains of bilateral ear flaking and itching.  No ear discharge, congestion, rash, fever.  HTN- compliant with amlodipine 5 mg, hydrochlorothiazide 25 mg, potassium chloride 20 meq qod. No cp.   CT lung cancer screening discussed at last visit. She continues to declines.  She reports hallucinations when trying Chantix in the past.  She thinks she also tried Zyban and recall side effects as well.  She prefers to quit smoking on her own.   HISTORY:  Past Medical History:  Diagnosis Date   Chicken pox    Emphysema of lung ([place])    Hyperlipidemia    Hypertension    Past Surgical History:  Procedure Laterality Date   ABDOMINAL HYSTERECTOMY  2000   complete hysterectomy, no ovaries. Done for noncancerous reasons , ovarian cyst. NO CERVIX on exam 10/19/20   APPENDECTOMY  1974   CHOLECYSTECTOMY     COLONOSCOPY WITH PROPOFOL N/A 05/22/2019   Procedure: COLONOSCOPY WITH PROPOFOL;  Surgeon: Valerie Bellows, MD;  Location: [facility] ENDOSCOPY;  Service: Gastroenterology;  Laterality: N/A;   DIAGNOSTIC LAPAROSCOPY     GALLBLADDER SURGERY     OVARIAN CYST SURGERY  2004   TONSILLECTOMY     TONSILLECTOMY     Family History  Problem Relation Age of Onset   Cancer Mother    Hyperlipidemia Mother    Hypertension Mother    Ovarian cancer Mother 23   COPD Father    Diabetes Father    Hyperlipidemia Father    Hypertension Sister    Hypertension Brother     Allergies: Chantix [varenicline] Current Outpatient Medications on File Prior to Visit  Medication Sig Dispense Refill   albuterol (VENTOLIN HFA) 108 (90 Base) MCG/ACT inhaler TAKE 2 PUFFS BY MOUTH EVERY 6 HOURS AS NEEDED FOR WHEEZE OR SHORTNESS OF BREATH 18 g 3   amLODipine-atorvastatin  (CADUET) 5-20 MG tablet Take 1 tablet by mouth every evening. 90 tablet 3   hydrochlorothiazide (HYDRODIURIL) 25 MG tablet TAKE 1 TABLET BY MOUTH EVERY DAY 90 tablet 1   loratadine (CLARITIN) 10 MG tablet Take 10 mg by mouth daily.     Multiple Vitamins-Minerals (PRESERVISION AREDS 2+MULTI VIT PO) Take 1 tablet by mouth.     potassium chloride (KLOR-CON) 20 MEQ packet Take 20 mEq by mouth daily. 90 packet 1   No current facility-administered medications on file prior to visit.    Social History   Tobacco Use   Smoking status: Former    Types: Cigarettes    Quit date: 11/15/2019    Years since quitting: 1.3   Smokeless tobacco: Never  Vaping Use   Vaping Use: Never used  Substance Use Topics   Alcohol use: Never   Drug use: Never    Review of Systems  Constitutional:  Negative for chills and fever.  HENT:  Negative for congestion, ear discharge and ear pain.   Respiratory:  Negative for cough.   Cardiovascular:  Negative for chest pain and palpitations.  Gastrointestinal:  Negative for nausea and vomiting.     Objective:    BP 124/84    Pulse 83    Temp 98.5 F (36.9 C) (Oral)    Ht  5\' 3"  (1.6 m)    Wt 168 lb 3.2 oz (76.3 kg)    SpO2 96%    BMI 29.80 kg/m  BP Readings from Last 3 Encounters:  03/08/21 124/84  10/19/20 118/90  08/16/20 136/90   Wt Readings from Last 3 Encounters:  03/08/21 168 lb 3.2 oz (76.3 kg)  10/19/20 172 lb 12.8 oz (78.4 kg)  08/16/20 175 lb 9.6 oz (79.7 kg)    Physical Exam Vitals reviewed.  Constitutional:      Appearance: She is well-developed.  HENT:     Right Ear: Tympanic membrane normal. No decreased hearing noted. Tympanic membrane is not erythematous.     Left Ear: Tympanic membrane normal. No decreased hearing noted. Tympanic membrane is not erythematous.     Ears:     Comments: Flaky excoriated skin noted bilateral ear canal.  No discharge Eyes:     Conjunctiva/sclera: Conjunctivae normal.  Cardiovascular:     Rate and Rhythm:  Normal rate and regular rhythm.     Pulses: Normal pulses.     Heart sounds: Normal heart sounds.  Pulmonary:     Effort: Pulmonary effort is normal.     Breath sounds: Normal breath sounds. No wheezing, rhonchi or rales.  Skin:    General: Skin is warm and dry.  Neurological:     Mental Status: She is alert.  Psychiatric:        Speech: Speech normal.        Behavior: Behavior normal.        Thought Content: Thought content normal.       Assessment & Plan:   Problem List Items Addressed This Visit       Cardiovascular and Mediastinum   Essential hypertension    Improved.  Continue amlodipine 5 mg, hydrochlorothiazide 25 mg, potassium chloride 20 meq qod.        Other   Encounter for smoking cessation counseling    She declines CT lung cancer screening at this time. Advised discussion of counseling, how to wear nicotine patch with Valerie Larson, pharmD. She declines at this time.       Itching of ear - Primary    Symptoms consistent with atopic dermatitis.  Trial of Elocon.  Patient will let me know how she is doing      Relevant Medications   mometasone (ELOCON) 0.1 % cream   Vitamin D deficiency    Hold vitamin D 1000 units daily and start prescription vitamin D.  Repeat vitamin D at follow up      Relevant Medications   Cholecalciferol 1.25 MG (50000 UT) TABS     I have discontinued Valerie Larson's Cholecalciferol. I am also having her start on Cholecalciferol and mometasone. Additionally, I am having her maintain her loratadine, Multiple Vitamins-Minerals (PRESERVISION AREDS 2+MULTI VIT PO), albuterol, potassium chloride, amLODipine-atorvastatin, and hydrochlorothiazide.   Meds ordered this encounter  Medications   Cholecalciferol 1.25 MG (50000 UT) TABS    Sig: 50,000 units PO qwk for 8 weeks.    Dispense:  8 tablet    Refill:  0    Order Specific Question:   Supervising Provider    Answer:   Valerie Larson [[id number]]   mometasone (ELOCON) 0.1 % cream     Sig: Apply 1 application topically daily. Appear pea sized ( or less) amount to external ear, and slightly in canal.    Dispense:  45 g    Refill:  1    Order Specific  Question:   Supervising Provider    Answer:   Valerie Larson [[id number]]    Return precautions given.   Risks, benefits, and alternatives of the medications and treatment plan prescribed today were discussed, and patient expressed understanding.   Education regarding symptom management and diagnosis given to patient on AVS.  Continue to follow with Burnard Hawthorne, FNP for routine health maintenance.   [place] and I agreed with plan.   Mable Paris, FNP

## 2021-03-08 NOTE — Assessment & Plan Note (Signed)
Symptoms consistent with atopic dermatitis.  Trial of Elocon.  Patient will let me know how she is doing

## 2021-03-08 NOTE — Assessment & Plan Note (Signed)
Improved.  Continue amlodipine 5 mg, hydrochlorothiazide 25 mg, potassium chloride 20 meq qod.

## 2021-03-29 ENCOUNTER — Other Ambulatory Visit: Payer: Self-pay | Admitting: Family

## 2021-03-29 DIAGNOSIS — E559 Vitamin D deficiency, unspecified: Secondary | ICD-10-CM

## 2021-03-31 ENCOUNTER — Other Ambulatory Visit: Payer: Self-pay | Admitting: Family

## 2021-04-18 ENCOUNTER — Other Ambulatory Visit: Payer: Self-pay

## 2021-04-18 ENCOUNTER — Ambulatory Visit (INDEPENDENT_AMBULATORY_CARE_PROVIDER_SITE_OTHER): Payer: Medicare Other

## 2021-04-18 VITALS — BP 115/76 | Ht 63.0 in | Wt 168.0 lb

## 2021-04-18 DIAGNOSIS — Z0000 Encounter for general adult medical examination without abnormal findings: Secondary | ICD-10-CM

## 2021-04-18 DIAGNOSIS — J069 Acute upper respiratory infection, unspecified: Secondary | ICD-10-CM

## 2021-04-18 MED ORDER — ALBUTEROL SULFATE HFA 108 (90 BASE) MCG/ACT IN AERS: INHALATION_SPRAY | RESPIRATORY_TRACT | 3 refills | Status: DC

## 2021-04-18 NOTE — Progress Notes (Signed)
Subjective:   Valerie Larson is a 66 y.o. female who presents for Medicare Annual (Subsequent) preventive examination.  Review of Systems    No ROS.  Medicare Wellness Virtual Visit.  Visual/audio telehealth visit, UTA vital signs.   See social history for additional risk factors.   Cardiac Risk Factors include: advanced age (>61men, >61 women)     Objective:    Today's Vitals   04/18/21 0906  BP: 115/76  Weight: 168 lb (76.2 kg)  Height: 5\' 3"  (1.6 m)   Body mass index is 29.76 kg/m.  Advanced Directives 04/18/2021 04/15/2020 05/22/2019 04/15/2019  Does Patient Have a Medical Advance Directive? No No No No  Would patient like information on creating a medical advance directive? No - Patient declined No - Patient declined - No - Patient declined    Current Medications (verified) Outpatient Encounter Medications as of 04/18/2021  Medication Sig   albuterol (VENTOLIN HFA) 108 (90 Base) MCG/ACT inhaler TAKE 2 PUFFS BY MOUTH EVERY 6 HOURS AS NEEDED FOR WHEEZE OR SHORTNESS OF BREATH   amLODipine-atorvastatin (CADUET) 5-20 MG tablet Take 1 tablet by mouth every evening.   Azelastine HCl 137 MCG/SPRAY SOLN PLACE 2 SPRAY(S) INTO BOTH NOSTRILS EVERY 12 HOURS AS NEEDED   Cholecalciferol 1.25 MG (50000 UT) TABS 50,000 units PO qwk for 8 weeks.   hydrochlorothiazide (HYDRODIURIL) 25 MG tablet TAKE 1 TABLET BY MOUTH EVERY DAY   loratadine (CLARITIN) 10 MG tablet Take 10 mg by mouth daily.   mometasone (ELOCON) 0.1 % cream Apply 1 application topically daily. Appear pea sized ( or less) amount to external ear, and slightly in canal.   Multiple Vitamins-Minerals (PRESERVISION AREDS 2+MULTI VIT PO) Take 1 tablet by mouth.   potassium chloride (KLOR-CON) 20 MEQ packet Take 20 mEq by mouth daily.   No facility-administered encounter medications on file as of 04/18/2021.    Allergies (verified) Chantix [varenicline]   History: Past Medical History:  Diagnosis Date   Chicken pox     Emphysema of lung (HCC)    Hyperlipidemia    Hypertension    Past Surgical History:  Procedure Laterality Date   ABDOMINAL HYSTERECTOMY  2000   complete hysterectomy, no ovaries. Done for noncancerous reasons , ovarian cyst. NO CERVIX on exam 10/19/20   APPENDECTOMY  1974   CHOLECYSTECTOMY     COLONOSCOPY WITH PROPOFOL N/A 05/22/2019   Procedure: COLONOSCOPY WITH PROPOFOL;  Surgeon: Wyline Mood, MD;  Location: [facility] ENDOSCOPY;  Service: Gastroenterology;  Laterality: N/A;   DIAGNOSTIC LAPAROSCOPY     GALLBLADDER SURGERY     OVARIAN CYST SURGERY  2004   TONSILLECTOMY     TONSILLECTOMY     Family History  Problem Relation Age of Onset   Cancer Mother    Hyperlipidemia Mother    Hypertension Mother    Ovarian cancer Mother 18   COPD Father    Diabetes Father    Hyperlipidemia Father    Hypertension Sister    Hypertension Brother    Social History   Socioeconomic History   Marital status: Married    Spouse name: Not on file   Number of children: Not on file   Years of education: Not on file   Highest education level: Not on file  Occupational History   Not on file  Tobacco Use   Smoking status: Former    Types: Cigarettes    Quit date: 11/15/2019    Years since quitting: 1.4   Smokeless tobacco: Never  Vaping  Use   Vaping Use: Never used  Substance and Sexual Activity   Alcohol use: Never   Drug use: Never   Sexual activity: Not on file  Other Topics Concern   Not on file  Social History Narrative   Grandson 3 , diagnosed with leukemia at 2. He is doing well, but she still worries about him.    Social Determinants of Health   Financial Resource Strain: Low Risk    Difficulty of Paying Living Expenses: Not hard at all  Food Insecurity: No Food Insecurity   Worried About Programme researcher, broadcasting/film/video in the Last Year: Never true   Ran Out of Food in the Last Year: Never true  Transportation Needs: No Transportation Needs   Lack of Transportation (Medical): No   Lack of  Transportation (Non-Medical): No  Physical Activity: Not on file  Stress: No Stress Concern Present   Feeling of Stress : Not at all  Social Connections: Unknown   Frequency of Communication with Friends and Family: More than three times a week   Frequency of Social Gatherings with Friends and Family: More than three times a week   Attends Religious Services: Not on Scientist, clinical (histocompatibility and immunogenetics) or Organizations: Not on file   Attends Banker Meetings: Not on file   Marital Status: Married   Tobacco Counseling Counseling given: Not Answered  Clinical Intake:  Pre-visit preparation completed: Yes        Diabetes: No  How often do you need to have someone help you when you read instructions, pamphlets, or other written materials from your doctor or pharmacy?: 1 - Never   Interpreter Needed?: No      Activities of Daily Living In your present state of health, do you have any difficulty performing the following activities: 04/18/2021  Hearing? N  Vision? N  Difficulty concentrating or making decisions? N  Walking or climbing stairs? N  Dressing or bathing? N  Doing errands, shopping? N  Preparing Food and eating ? N  Using the Toilet? N  In the past six months, have you accidently leaked urine? N  Do you have problems with loss of bowel control? N  Managing your Medications? N  Managing your Finances? N  Housekeeping or managing your Housekeeping? N  Some recent data might be hidden   Patient Care Team: Allegra Grana, FNP as PCP - General (Family Medicine)  Indicate any recent Medical Services you may have received from other than [facility] providers in the past year (date may be approximate).     Assessment:   This is a routine wellness examination for Valerie Larson.  Virtual Visit via Telephone Note  I connected with  Valerie Larson on 04/18/21 at  9:00 AM EDT by telephone and verified that I am speaking with the correct person using two  identifiers.  Persons participating in the virtual visit: patient/Nurse Health Advisor   I discussed the limitations, risks, security and privacy concerns of performing an evaluation and management service by telephone and the availability of in person appointments. The patient expressed understanding and agreed to proceed.  Interactive audio and video telecommunications were attempted between this nurse and patient, however failed, due to patient having technical difficulties OR patient did not have access to video capability.  We continued and completed visit with audio only.  Some vital signs may be absent or patient reported.   Hearing/Vision screen Hearing Screening - Comments::  Patient is able to hear  conversational tones without difficulty. No issues reported.  Vision Screening - Comments:: Followed by [facility]  Wears corrective lenses  They have seen their ophthalmologist in the last 12 months.   Dietary issues and exercise activities discussed: Current Exercise Habits: Home exercise routine, Type of exercise: walking, Time (Minutes): 30, Frequency (Times/Week): 3, Weekly Exercise (Minutes/Week): 90, Intensity: Mild Healthy diet Good water intake   Goals Addressed               This Visit's Progress     Patient Stated     Maintain Healthy Lifestyle (pt-stated)        Stay active Healthy diet       Depression Screen PHQ 2/9 Scores 04/18/2021 04/21/2020 04/15/2020 04/15/2019 01/20/2019  PHQ - 2 Score 0 0 0 0 0  PHQ- 9 Score - 1 - - -    Fall Risk Fall Risk  04/18/2021 04/15/2020 04/15/2019 01/20/2019  Falls in the past year? 0 0 0 0  Number falls in past yr: - 0 - -  Injury with Fall? - 0 - -  Follow up Falls evaluation completed Falls evaluation completed Falls evaluation completed Falls evaluation completed    FALL RISK PREVENTION PERTAINING TO THE HOME: Home free of loose throw rugs in walkways, pet beds, electrical cords, etc? Yes  Adequate lighting  in your home to reduce risk of falls? Yes   ASSISTIVE DEVICES UTILIZED TO PREVENT FALLS: Life alert? No  Use of a cane, walker or w/c? No   TIMED UP AND GO: Was the test performed? No .   Cognitive Function: Patient is alert and oriented x3.  MMSE - Mini Mental State Exam 04/15/2019  Not completed: Unable to complete     6CIT Screen 04/15/2019  What Year? 0 points  What month? 0 points  What time? 0 points   Immunizations Immunization History  Administered Date(s) Administered   Fluad Quad(high Dose 65+) 02/12/2019, 12/09/2019, 10/19/2020   PFIZER Comirnaty(Gray Top)Covid-19 Tri-Sucrose Vaccine 03/17/2019, 04/05/2019   PFIZER(Purple Top)SARS-COV-2 Vaccination 03/17/2019, 04/05/2019, 02/04/2020   PNEUMOCOCCAL CONJUGATE-20 10/19/2020   Pneumococcal Polysaccharide-23 02/11/2007   Shingrix Completed?: No.    Education has been provided regarding the importance of this vaccine. Patient has been advised to call insurance company to determine out of pocket expense if they have not yet received this vaccine. Advised may also receive vaccine at local pharmacy or Health Dept. Verbalized acceptance and understanding.  Screening Tests Health Maintenance  Topic Date Due   Zoster Vaccines- Shingrix (1 of 2) 07/19/2021 (Originally 03/09/1974)   TETANUS/TDAP  04/19/2022 (Originally 03/09/1974)   MAMMOGRAM  10/08/2022   COLONOSCOPY (Pts 45-95yrs Insurance coverage will need to be confirmed)  05/21/2024   Pneumonia Vaccine 66+ Years old  Completed   INFLUENZA VACCINE  Completed   DEXA SCAN  Completed   COVID-19 Vaccine  Completed   Hepatitis C Screening  Completed   HPV VACCINES  Aged Out   Health Maintenance There are no preventive care reminders to display for this patient.  Lung Cancer Screening: (Low Dose CT Chest recommended if Age 45-80 years, 30 pack-year currently smoking OR have quit w/in 15years.) does not qualify.   Vision Screening: Recommended annual ophthalmology exams for early  detection of glaucoma and other disorders of the eye.  Dental Screening: Recommended annual dental exams for proper oral hygiene  Community Resource Referral / Chronic Care Management: CRR required this visit?  No   CCM required this visit?  No  Plan:   Keep all routine maintenance appointments.   I have personally reviewed and noted the following in the patient's chart:   Medical and social history Use of alcohol, tobacco or illicit drugs  Current medications and supplements including opioid prescriptions. Albuterol ordered per request.  Functional ability and status Nutritional status Physical activity Advanced directives List of other physicians Hospitalizations, surgeries, and ER visits in previous 12 months Vitals Screenings to include cognitive, depression, and falls Referrals and appointments  In addition, I have reviewed and discussed with patient certain preventive protocols, quality metrics, and best practice recommendations. A written personalized care plan for preventive services as well as general preventive health recommendations were provided to patient via mychart.     Ashok Pall, LPN   4/54/0981

## 2021-04-18 NOTE — Patient Instructions (Addendum)
Valerie Larson , Thank you for taking time to come for your Medicare Wellness Visit. I appreciate your ongoing commitment to your health goals. Please review the following plan we discussed and let me know if I can assist you in the future.   These are the goals we discussed:  Goals       Patient Stated     Maintain Healthy Lifestyle (pt-stated)      Stay active Healthy diet        This is a list of the screening recommended for you and due dates:  Health Maintenance  Topic Date Due   Zoster (Shingles) Vaccine (1 of 2) 07/19/2021*   Tetanus Vaccine  04/19/2022*   Mammogram  10/08/2022   Colon Cancer Screening  05/21/2024   Pneumonia Vaccine  Completed   Flu Shot  Completed   DEXA scan (bone density measurement)  Completed   COVID-19 Vaccine  Completed   Hepatitis C Screening: USPSTF Recommendation to screen - Ages 29-79 yo.  Completed   HPV Vaccine  Aged Out  *Topic was postponed. The date shown is not the original due date.    Advanced directives: not yet completed  Conditions/risks identified: none new  Next appointment: Follow up in one year for your annual wellness visit    Preventive Care 65 Years and Older, Female Preventive care refers to lifestyle choices and visits with your health care provider that can promote health and wellness. What does preventive care include? A yearly physical exam. This is also called an annual well check. Dental exams once or twice a year. Routine eye exams. Ask your health care provider how often you should have your eyes checked. Personal lifestyle choices, including: Daily care of your teeth and gums. Regular physical activity. Eating a healthy diet. Avoiding tobacco and drug use. Limiting alcohol use. Practicing safe sex. Taking low-dose aspirin every day. Taking vitamin and mineral supplements as recommended by your health care provider. What happens during an annual well check? The services and screenings done by your health  care provider during your annual well check will depend on your age, overall health, lifestyle risk factors, and family history of disease. Counseling  Your health care provider may ask you questions about your: Alcohol use. Tobacco use. Drug use. Emotional well-being. Home and relationship well-being. Sexual activity. Eating habits. History of falls. Memory and ability to understand (cognition). Work and work Statistician. Reproductive health. Screening  You may have the following tests or measurements: Height, weight, and BMI. Blood pressure. Lipid and cholesterol levels. These may be checked every 5 years, or more frequently if you are over 107 years old. Skin check. Lung cancer screening. You may have this screening every year starting at age 15 if you have a 30-pack-year history of smoking and currently smoke or have quit within the past 15 years. Fecal occult blood test (FOBT) of the stool. You may have this test every year starting at age 73. Flexible sigmoidoscopy or colonoscopy. You may have a sigmoidoscopy every 5 years or a colonoscopy every 10 years starting at age 78. Hepatitis C blood test. Hepatitis B blood test. Sexually transmitted disease (STD) testing. Diabetes screening. This is done by checking your blood sugar (glucose) after you have not eaten for a while (fasting). You may have this done every 1-3 years. Bone density scan. This is done to screen for osteoporosis. You may have this done starting at age 68. Mammogram. This may be done every 1-2 years. Talk to your  health care provider about how often you should have regular mammograms. Talk with your health care provider about your test results, treatment options, and if necessary, the need for more tests. Vaccines  Your health care provider may recommend certain vaccines, such as: Influenza vaccine. This is recommended every year. Tetanus, diphtheria, and acellular pertussis (Tdap, Td) vaccine. You may need a Td  booster every 10 years. Zoster vaccine. You may need this after age 56. Pneumococcal 13-valent conjugate (PCV13) vaccine. One dose is recommended after age 58. Pneumococcal polysaccharide (PPSV23) vaccine. One dose is recommended after age 1. Talk to your health care provider about which screenings and vaccines you need and how often you need them. This information is not intended to replace advice given to you by your health care provider. Make sure you discuss any questions you have with your health care provider. Document Released: 02/19/2015 Document Revised: 10/13/2015 Document Reviewed: 11/24/2014 Elsevier Interactive Patient Education  2017 [place] Prevention in the Home Falls can cause injuries. They can happen to people of all ages. There are many things you can do to make your home safe and to help prevent falls. What can I do on the outside of my home? Regularly fix the edges of walkways and driveways and fix any cracks. Remove anything that might make you trip as you walk through a door, such as a raised step or threshold. Trim any bushes or trees on the path to your home. Use bright outdoor lighting. Clear any walking paths of anything that might make someone trip, such as rocks or tools. Regularly check to see if handrails are loose or broken. Make sure that both sides of any steps have handrails. Any raised decks and porches should have guardrails on the edges. Have any leaves, snow, or ice cleared regularly. Use sand or salt on walking paths during winter. Clean up any spills in your garage right away. This includes oil or grease spills. What can I do in the bathroom? Use night lights. Install grab bars by the toilet and in the tub and shower. Do not use towel bars as grab bars. Use non-skid mats or decals in the tub or shower. If you need to sit down in the shower, use a plastic, non-slip stool. Keep the floor dry. Clean up any water that spills on the floor  as soon as it happens. Remove soap buildup in the tub or shower regularly. Attach bath mats securely with double-sided non-slip rug tape. Do not have throw rugs and other things on the floor that can make you trip. What can I do in the bedroom? Use night lights. Make sure that you have a light by your bed that is easy to reach. Do not use any sheets or blankets that are too big for your bed. They should not hang down onto the floor. Have a firm chair that has side arms. You can use this for support while you get dressed. Do not have throw rugs and other things on the floor that can make you trip. What can I do in the kitchen? Clean up any spills right away. Avoid walking on wet floors. Keep items that you use a lot in easy-to-reach places. If you need to reach something above you, use a strong step stool that has a grab bar. Keep electrical cords out of the way. Do not use floor polish or wax that makes floors slippery. If you must use wax, use non-skid floor wax. Do not have  throw rugs and other things on the floor that can make you trip. What can I do with my stairs? Do not leave any items on the stairs. Make sure that there are handrails on both sides of the stairs and use them. Fix handrails that are broken or loose. Make sure that handrails are as long as the stairways. Check any carpeting to make sure that it is firmly attached to the stairs. Fix any carpet that is loose or worn. Avoid having throw rugs at the top or bottom of the stairs. If you do have throw rugs, attach them to the floor with carpet tape. Make sure that you have a light switch at the top of the stairs and the bottom of the stairs. If you do not have them, ask someone to add them for you. What else can I do to help prevent falls? Wear shoes that: Do not have high heels. Have rubber bottoms. Are comfortable and fit you well. Are closed at the toe. Do not wear sandals. If you use a stepladder: Make sure that it is  fully opened. Do not climb a closed stepladder. Make sure that both sides of the stepladder are locked into place. Ask someone to hold it for you, if possible. Clearly mark and make sure that you can see: Any grab bars or handrails. First and last steps. Where the edge of each step is. Use tools that help you move around (mobility aids) if they are needed. These include: Canes. Walkers. Scooters. Crutches. Turn on the lights when you go into a dark area. Replace any light bulbs as soon as they burn out. Set up your furniture so you have a clear path. Avoid moving your furniture around. If any of your floors are uneven, fix them. If there are any pets around you, be aware of where they are. Review your medicines with your doctor. Some medicines can make you feel dizzy. This can increase your chance of falling. Ask your doctor what other things that you can do to help prevent falls. This information is not intended to replace advice given to you by your health care provider. Make sure you discuss any questions you have with your health care provider. Document Released: 11/19/2008 Document Revised: 07/01/2015 Document Reviewed: 02/27/2014 Elsevier Interactive Patient Education  2017 [employer].

## 2021-04-19 ENCOUNTER — Telehealth: Payer: Self-pay

## 2021-04-19 NOTE — Telephone Encounter (Signed)
?  Send to Prescriber ?Save ?Archive ?Other Actions ?Download / Print ?Renew ?Delete ?Notes ?1 minute ago ?View by you ?1 minute ago ?Request Sent ?1 minute ago ?Save - ePA Send to Plan by you ?4 minutes ago ?View by you ?4 minutes ago ?Question Set Received ?Older ?Add note ?  Set reminder  ?1 business day ? Add ?[facility] [place] Case ID: [id number] - Rx #: [id number] Need help? Call us at [phone] ?Status ?Sent to [facility] ?Drug ?Albuterol Sulfate HFA 108 (90 Base)MCG/ACT aerosol ?

## 2021-04-20 ENCOUNTER — Other Ambulatory Visit: Payer: Self-pay | Admitting: Family

## 2021-04-20 DIAGNOSIS — J069 Acute upper respiratory infection, unspecified: Secondary | ICD-10-CM

## 2021-04-27 ENCOUNTER — Other Ambulatory Visit: Payer: Self-pay | Admitting: Family

## 2021-04-29 ENCOUNTER — Other Ambulatory Visit: Payer: Self-pay

## 2021-04-29 MED ORDER — ALBUTEROL SULFATE HFA 108 (90 BASE) MCG/ACT IN AERS
INHALATION_SPRAY | RESPIRATORY_TRACT | 1 refills | Status: DC
Start: 2021-04-29 — End: 2021-07-11

## 2021-04-29 NOTE — Telephone Encounter (Signed)
I have changed to generic formulary alternative; ? ? ?

## 2021-05-18 ENCOUNTER — Other Ambulatory Visit: Payer: Self-pay | Admitting: Family

## 2021-05-18 DIAGNOSIS — L299 Pruritus, unspecified: Secondary | ICD-10-CM

## 2021-06-13 DIAGNOSIS — S42001A Fracture of unspecified part of right clavicle, initial encounter for closed fracture: Secondary | ICD-10-CM | POA: Diagnosis not present

## 2021-06-27 DIAGNOSIS — S42001A Fracture of unspecified part of right clavicle, initial encounter for closed fracture: Secondary | ICD-10-CM | POA: Diagnosis not present

## 2021-07-10 ENCOUNTER — Other Ambulatory Visit: Payer: Self-pay | Admitting: Family

## 2021-07-25 DIAGNOSIS — S42001A Fracture of unspecified part of right clavicle, initial encounter for closed fracture: Secondary | ICD-10-CM | POA: Diagnosis not present

## 2021-08-12 ENCOUNTER — Other Ambulatory Visit: Payer: Self-pay | Admitting: Family

## 2021-08-12 DIAGNOSIS — E785 Hyperlipidemia, unspecified: Secondary | ICD-10-CM

## 2021-08-12 DIAGNOSIS — I10 Essential (primary) hypertension: Secondary | ICD-10-CM

## 2021-08-14 ENCOUNTER — Other Ambulatory Visit: Payer: Self-pay | Admitting: Family

## 2021-09-03 ENCOUNTER — Other Ambulatory Visit: Payer: Self-pay | Admitting: Family

## 2021-09-06 ENCOUNTER — Ambulatory Visit: Payer: Medicare Other | Admitting: Family

## 2021-10-12 ENCOUNTER — Ambulatory Visit (INDEPENDENT_AMBULATORY_CARE_PROVIDER_SITE_OTHER): Payer: Medicare Other | Admitting: Family

## 2021-10-12 ENCOUNTER — Encounter: Payer: Self-pay | Admitting: Family

## 2021-10-12 VITALS — BP 118/68 | HR 82 | Temp 97.9°F | Ht 63.0 in | Wt 171.4 lb

## 2021-10-12 DIAGNOSIS — E559 Vitamin D deficiency, unspecified: Secondary | ICD-10-CM | POA: Diagnosis not present

## 2021-10-12 DIAGNOSIS — Z8639 Personal history of other endocrine, nutritional and metabolic disease: Secondary | ICD-10-CM | POA: Diagnosis not present

## 2021-10-12 DIAGNOSIS — R7303 Prediabetes: Secondary | ICD-10-CM | POA: Diagnosis not present

## 2021-10-12 DIAGNOSIS — E876 Hypokalemia: Secondary | ICD-10-CM | POA: Diagnosis not present

## 2021-10-12 DIAGNOSIS — E785 Hyperlipidemia, unspecified: Secondary | ICD-10-CM | POA: Diagnosis not present

## 2021-10-12 DIAGNOSIS — I10 Essential (primary) hypertension: Secondary | ICD-10-CM

## 2021-10-12 DIAGNOSIS — Z87898 Personal history of other specified conditions: Secondary | ICD-10-CM | POA: Diagnosis not present

## 2021-10-12 MED ORDER — POTASSIUM CHLORIDE 20 MEQ PO PACK: 20.0000 meq | PACK | Freq: Every day | ORAL | 1 refills | Status: DC

## 2021-10-12 NOTE — Progress Notes (Signed)
Subjective:    Patient ID: Valerie Larson, female    DOB: 1955-10-11, 66 y.o.   MRN: [id number]  CC: Valerie Larson is a 66 y.o. female who presents today for follow up.   HPI: Feels well today.  No new complaints.    HTN- compliant with amlodipine 5 mg, hydrochlorothiazide 25 mg, potassium chloride 20 meq qod.  Mammogram is scheduled.   She would like annual physical labs drawn  History of prediabetes, vitamin D deficiency HISTORY:  Past Medical History:  Diagnosis Date   Chicken pox    Emphysema of lung ([place])    Hyperlipidemia    Hypertension    Past Surgical History:  Procedure Laterality Date   ABDOMINAL HYSTERECTOMY  2000   complete hysterectomy, no ovaries. Done for noncancerous reasons , ovarian cyst. NO CERVIX on exam 10/19/20   APPENDECTOMY  1974   CHOLECYSTECTOMY     COLONOSCOPY WITH PROPOFOL N/A 05/22/2019   Procedure: COLONOSCOPY WITH PROPOFOL;  Surgeon: Jonathon Bellows, MD;  Location: [facility] ENDOSCOPY;  Service: Gastroenterology;  Laterality: N/A;   DIAGNOSTIC LAPAROSCOPY     GALLBLADDER SURGERY     OVARIAN CYST SURGERY  2004   TONSILLECTOMY     TONSILLECTOMY     Family History  Problem Relation Age of Onset   Cancer Mother    Hyperlipidemia Mother    Hypertension Mother    Ovarian cancer Mother 68   COPD Father    Diabetes Father    Hyperlipidemia Father    Hypertension Sister    Hypertension Brother     Allergies: Chantix [varenicline] Current Outpatient Medications on File Prior to Visit  Medication Sig Dispense Refill   albuterol (VENTOLIN HFA) 108 (90 Base) MCG/ACT inhaler TAKE 2 PUFFS BY MOUTH EVERY 6 HOURS AS NEEDED FOR WHEEZE OR SHORTNESS OF BREATH 8.5 each 1   amLODipine-atorvastatin (CADUET) 5-20 MG tablet TAKE 1 TABLET BY MOUTH EVERY DAY IN THE EVENING 90 tablet 3   Azelastine HCl 137 MCG/SPRAY SOLN PLACE 2 SPRAY(S) INTO BOTH NOSTRILS EVERY 12 HOURS AS NEEDED 30 mL 1   hydrochlorothiazide (HYDRODIURIL) 25 MG tablet TAKE 1 TABLET BY MOUTH  EVERY DAY 90 tablet 1   loratadine (CLARITIN) 10 MG tablet Take 10 mg by mouth daily.     Multiple Vitamins-Minerals (PRESERVISION AREDS 2+MULTI VIT PO) Take 1 tablet by mouth.     No current facility-administered medications on file prior to visit.    Social History   Tobacco Use   Smoking status: Former    Types: Cigarettes    Quit date: 11/15/2019    Years since quitting: 1.9   Smokeless tobacco: Never  Vaping Use   Vaping Use: Never used  Substance Use Topics   Alcohol use: Never   Drug use: Never    Review of Systems  Constitutional:  Negative for chills and fever.  Respiratory:  Negative for cough.   Cardiovascular:  Negative for chest pain and palpitations.  Gastrointestinal:  Negative for nausea and vomiting.      Objective:    BP 118/68 (BP Location: Left Arm, Patient Position: Sitting, Cuff Size: Normal)   Pulse 82   Temp 97.9 F (36.6 C) (Oral)   Ht '5\' 3"'$  (1.6 m)   Wt 171 lb 6.4 oz (77.7 kg)   SpO2 (!) 82%   BMI 30.36 kg/m  BP Readings from Last 3 Encounters:  10/12/21 118/68  04/18/21 115/76  03/08/21 124/84   Wt Readings from Last 3 Encounters:  10/12/21 171 lb 6.4 oz (77.7 kg)  04/18/21 168 lb (76.2 kg)  03/08/21 168 lb 3.2 oz (76.3 kg)    Physical Exam Vitals reviewed.  Constitutional:      Appearance: She is well-developed.  Eyes:     Conjunctiva/sclera: Conjunctivae normal.  Cardiovascular:     Rate and Rhythm: Normal rate and regular rhythm.     Pulses: Normal pulses.     Heart sounds: Normal heart sounds.  Pulmonary:     Effort: Pulmonary effort is normal.     Breath sounds: Normal breath sounds. No wheezing, rhonchi or rales.  Skin:    General: Skin is warm and dry.  Neurological:     Mental Status: She is alert.  Psychiatric:        Speech: Speech normal.        Behavior: Behavior normal.        Thought Content: Thought content normal.        Assessment & Plan:   Problem List Items Addressed This Visit        Cardiovascular and Mediastinum   Essential hypertension - Primary    Chronic, stable.  Continue amlodipine 5 mg, hydrochlorothiazide 25 mg, potassium chloride 20 meq qod.      Relevant Medications   potassium chloride (KLOR-CON) 20 MEQ packet   Other Relevant Orders   CBC with Differential/Platelet   Comprehensive metabolic panel   Hemoglobin A1c   Lipid panel   TSH   VITAMIN D 25 Hydroxy (Vit-D Deficiency, Fractures)     Other   Hyperlipidemia     chronic, presumed stable.  Pending lipid panel.  Continue atorvastatin 20 mg      Prediabetes   Relevant Orders   Hemoglobin A1c   Vitamin D deficiency   Relevant Orders   VITAMIN D 25 Hydroxy (Vit-D Deficiency, Fractures)   Other Visit Diagnoses     Hypokalemia       Relevant Medications   potassium chloride (KLOR-CON) 20 MEQ packet   History of vitamin D deficiency       Relevant Orders   VITAMIN D 25 Hydroxy (Vit-D Deficiency, Fractures)   History of prediabetes       Relevant Orders   Hemoglobin A1c        I have discontinued [name] [employer]'s Cholecalciferol and mometasone. I am also having her maintain her loratadine, Multiple Vitamins-Minerals (PRESERVISION AREDS 2+MULTI VIT PO), Azelastine HCl, amLODipine-atorvastatin, hydrochlorothiazide, albuterol, and potassium chloride.   Meds ordered this encounter  Medications   potassium chloride (KLOR-CON) 20 MEQ packet    Sig: Take 20 mEq by mouth daily.    Dispense:  90 packet    Refill:  1    Order Specific Question:   Supervising Provider    Answer:   Crecencio Mc [[id number]]    Return precautions given.   Risks, benefits, and alternatives of the medications and treatment plan prescribed today were discussed, and patient expressed understanding.   Education regarding symptom management and diagnosis given to patient on AVS.  Continue to follow with Burnard Hawthorne, FNP for routine health maintenance.   [place] and I agreed with plan.   Mable Paris, FNP

## 2021-10-12 NOTE — Assessment & Plan Note (Signed)
chronic, presumed stable.  Pending lipid panel.  Continue atorvastatin 20 mg

## 2021-10-12 NOTE — Assessment & Plan Note (Signed)
Chronic, stable.  Continue amlodipine 5 mg, hydrochlorothiazide 25 mg, potassium chloride 20 meq qod.

## 2021-10-12 NOTE — Patient Instructions (Signed)
Nice to see you!   

## 2021-10-26 ENCOUNTER — Other Ambulatory Visit (INDEPENDENT_AMBULATORY_CARE_PROVIDER_SITE_OTHER): Payer: Medicare Other

## 2021-10-26 DIAGNOSIS — R7303 Prediabetes: Secondary | ICD-10-CM

## 2021-10-26 DIAGNOSIS — Z87898 Personal history of other specified conditions: Secondary | ICD-10-CM | POA: Diagnosis not present

## 2021-10-26 DIAGNOSIS — E559 Vitamin D deficiency, unspecified: Secondary | ICD-10-CM | POA: Diagnosis not present

## 2021-10-26 DIAGNOSIS — I10 Essential (primary) hypertension: Secondary | ICD-10-CM

## 2021-10-26 DIAGNOSIS — Z8639 Personal history of other endocrine, nutritional and metabolic disease: Secondary | ICD-10-CM

## 2021-10-26 LAB — CBC WITH DIFFERENTIAL/PLATELET
Basophils Absolute: 0 10*3/uL (ref 0.0–0.1)
Basophils Relative: 0.4 % (ref 0.0–3.0)
Eosinophils Absolute: 0.1 10*3/uL (ref 0.0–0.7)
Eosinophils Relative: 1.8 % (ref 0.0–5.0)
HCT: 41.7 % (ref 36.0–46.0)
Hemoglobin: 14.2 g/dL (ref 12.0–15.0)
Lymphocytes Relative: 44.6 % (ref 12.0–46.0)
Lymphs Abs: 3.3 10*3/uL (ref 0.7–4.0)
MCHC: 34 g/dL (ref 30.0–36.0)
MCV: 91.4 fl (ref 78.0–100.0)
Monocytes Absolute: 0.4 10*3/uL (ref 0.1–1.0)
Monocytes Relative: 5.7 % (ref 3.0–12.0)
Neutro Abs: 3.6 10*3/uL (ref 1.4–7.7)
Neutrophils Relative %: 47.5 % (ref 43.0–77.0)
Platelets: 298 10*3/uL (ref 150.0–400.0)
RBC: 4.57 Mil/uL (ref 3.87–5.11)
RDW: 13.3 % (ref 11.5–15.5)
WBC: 7.5 10*3/uL (ref 4.0–10.5)

## 2021-10-26 LAB — HEMOGLOBIN A1C: Hgb A1c MFr Bld: 6.1 % (ref 4.6–6.5)

## 2021-10-26 LAB — LIPID PANEL
Cholesterol: 127 mg/dL (ref 0–200)
HDL: 33.7 mg/dL — ABNORMAL LOW (ref >39.00)
LDL Cholesterol: 64 mg/dL (ref 0–99)
NonHDL: 93.62
Total CHOL/HDL Ratio: 4
Triglycerides: 147 mg/dL (ref 0.0–149.0)
VLDL: 29.4 mg/dL (ref 0.0–40.0)

## 2021-10-26 LAB — COMPREHENSIVE METABOLIC PANEL
ALT: 8 U/L (ref 0–35)
AST: 15 U/L (ref 0–37)
Albumin: 3.5 g/dL (ref 3.5–5.2)
Alkaline Phosphatase: 73 U/L (ref 39–117)
BUN: 9 mg/dL (ref 6–23)
CO2: 31 mEq/L (ref 19–32)
Calcium: 9.8 mg/dL (ref 8.4–10.5)
Chloride: 102 mEq/L (ref 96–112)
Creatinine, Ser: 0.96 mg/dL (ref 0.40–1.20)
GFR: 60.74 mL/min (ref >60.00)
Glucose, Bld: 98 mg/dL (ref 70–99)
Potassium: 3.3 mEq/L — ABNORMAL LOW (ref 3.5–5.1)
Sodium: 142 mEq/L (ref 135–145)
Total Bilirubin: 1 mg/dL (ref 0.2–1.2)
Total Protein: 6.5 g/dL (ref 6.0–8.3)

## 2021-10-26 LAB — TSH: TSH: 4.6 u[IU]/mL (ref 0.35–5.50)

## 2021-10-26 LAB — VITAMIN D 25 HYDROXY (VIT D DEFICIENCY, FRACTURES): VITD: 32.87 ng/mL (ref 30.00–100.00)

## 2021-10-29 IMAGING — MG MM DIGITAL SCREENING BILAT W/ TOMO AND CAD
8 series · 8 of 24 positions shown · non-contrast
Comparison: Previous exam(s).

CLINICAL DATA: Screening.

EXAM:
DIGITAL SCREENING BILATERAL MAMMOGRAM WITH TOMOSYNTHESIS AND CAD
TECHNIQUE: Bilateral screening digital craniocaudal and mediolateral oblique
mammograms were obtained. Bilateral screening digital breast
tomosynthesis was performed. The images were evaluated with
computer-aided detection.

[L CC synth-2D]
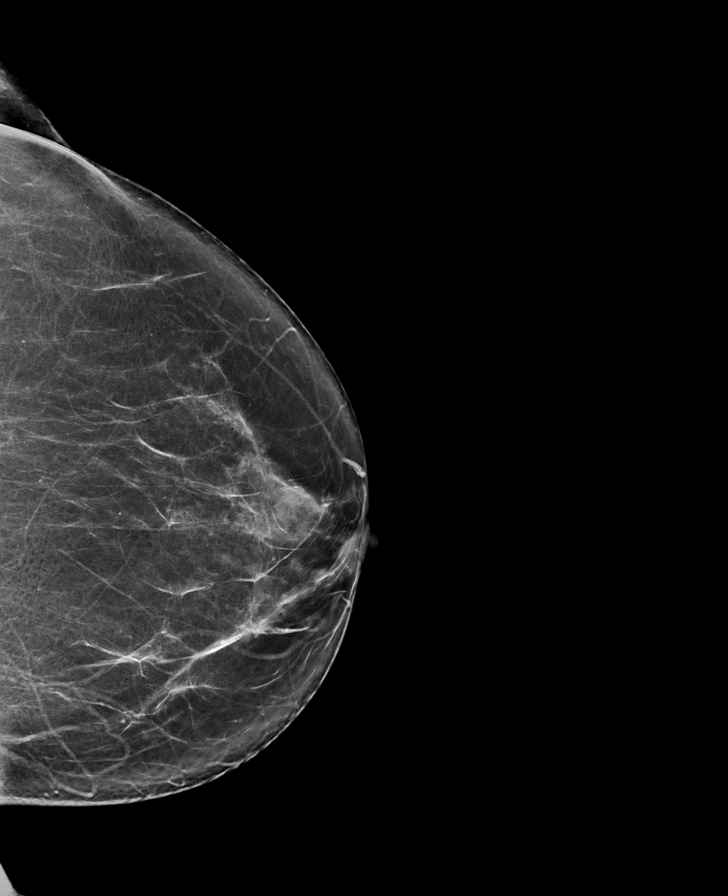

[R MLO synth-2D]
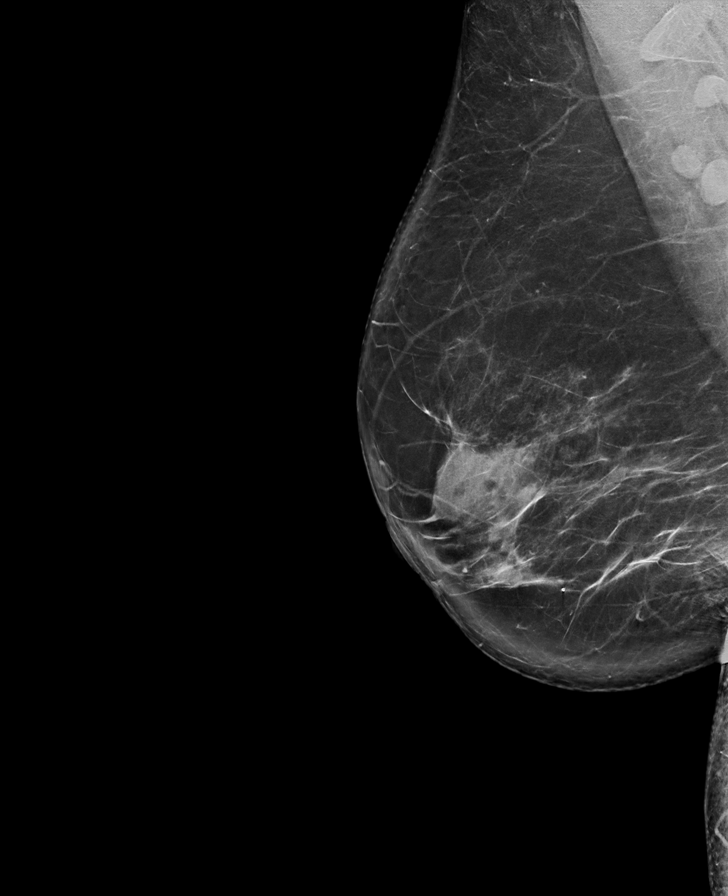

[R CC synth-2D]
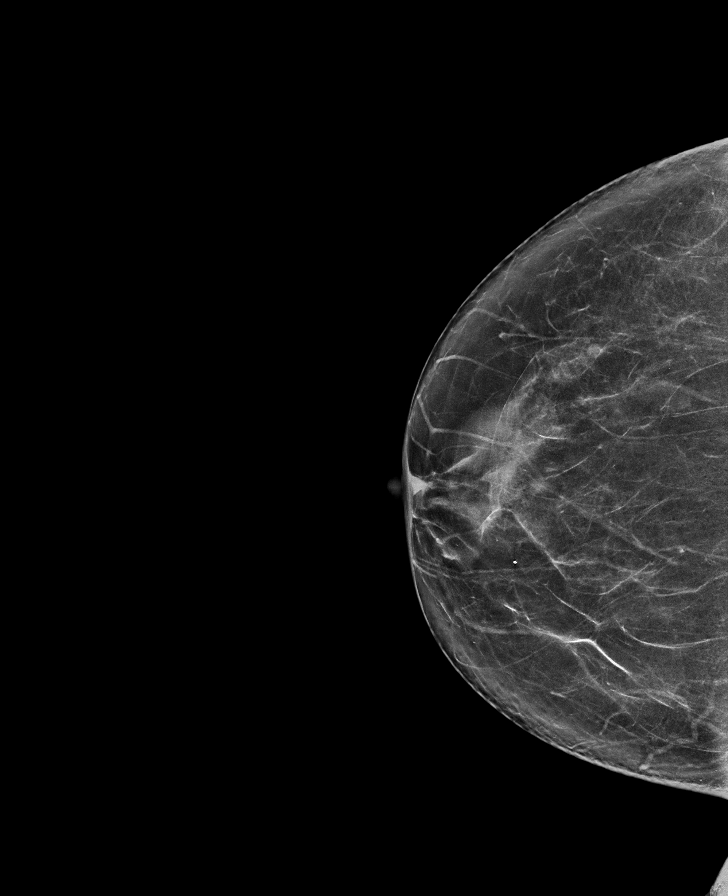

[L MLO synth-2D]
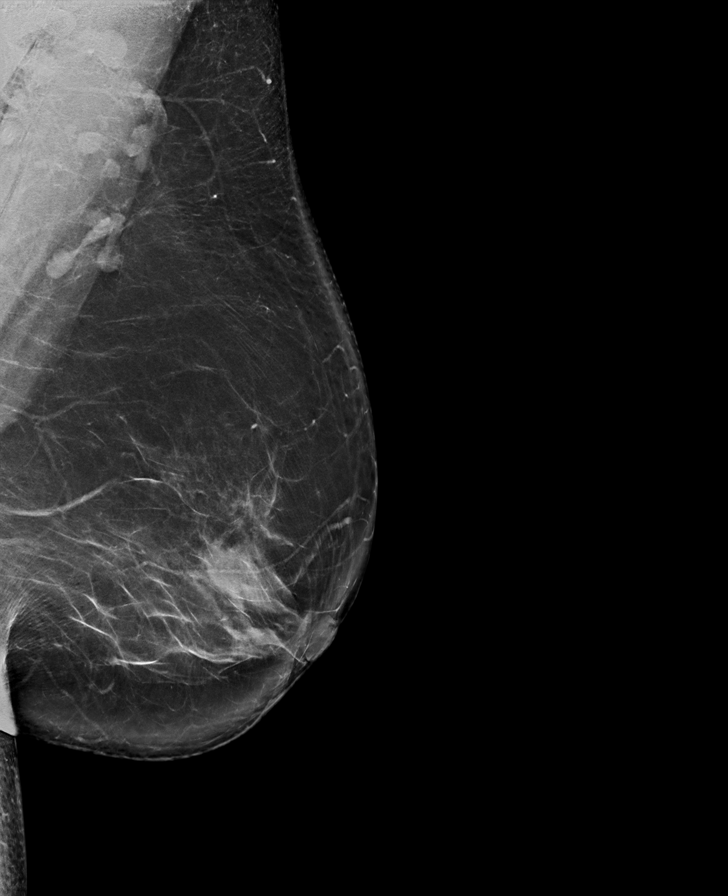

[L MLO tomo · tomo slice 51/100.0]
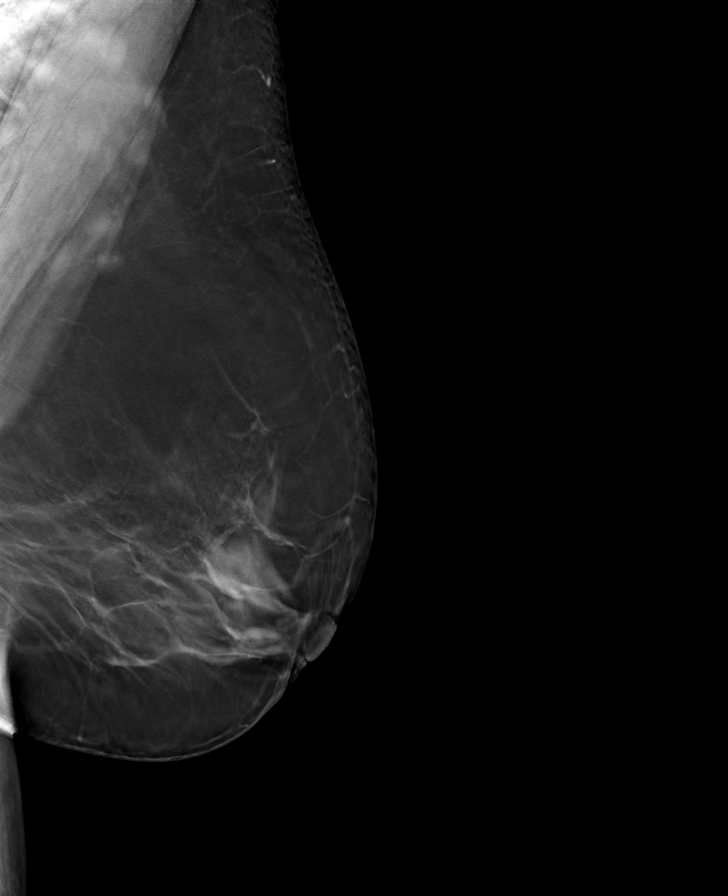

[R CC tomo · tomo slice 39/77.0]
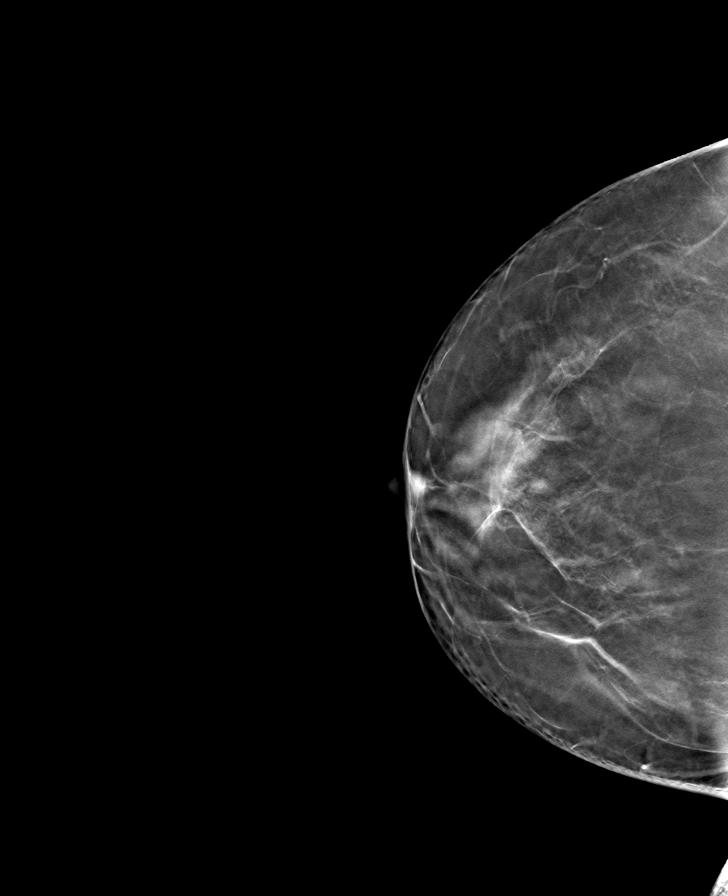

[R MLO tomo · tomo slice 48/95.0]
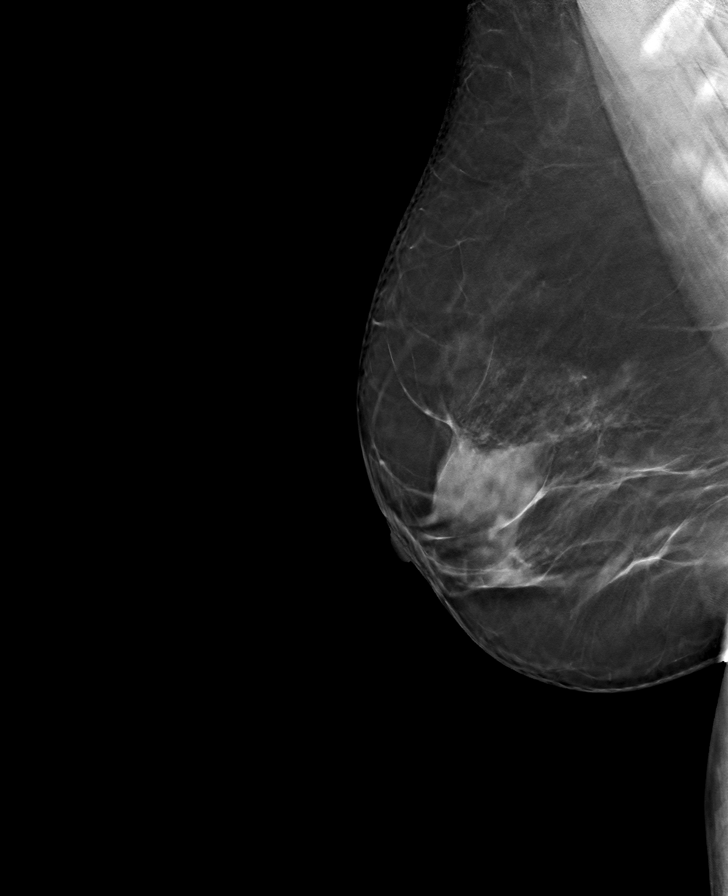

[L CC tomo · tomo slice 38/75.0]
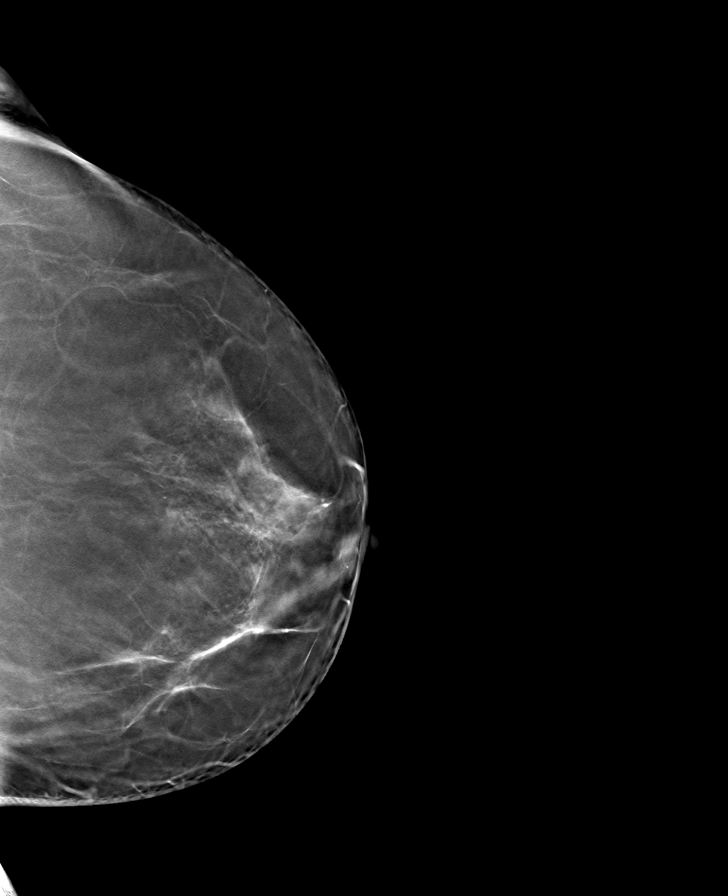

[8 of 24 positions shown; findings below may reference images not displayed]

ACR Breast Density Category b: There are scattered areas of
fibroglandular density.
FINDINGS: There are no findings suspicious for malignancy.
IMPRESSION: No mammographic evidence of malignancy. A result letter of this
screening mammogram will be mailed directly to the patient.

RECOMMENDATION:
Screening mammogram in one year. (Code:51-O-LD2)

BI-RADS CATEGORY  1: Negative.

## 2021-11-02 ENCOUNTER — Other Ambulatory Visit: Payer: Self-pay | Admitting: Family

## 2021-11-02 DIAGNOSIS — Z1231 Encounter for screening mammogram for malignant neoplasm of breast: Secondary | ICD-10-CM

## 2021-11-30 ENCOUNTER — Telehealth: Payer: Self-pay | Admitting: Family

## 2021-11-30 NOTE — Telephone Encounter (Signed)
Pt want to be called regarding a medication that she was-amlodipine. Pt stated it was changed and she wanted to know why

## 2021-12-02 ENCOUNTER — Ambulatory Visit
Admission: RE | Admit: 2021-12-02 | Discharge: 2021-12-02 | Disposition: A | Discharge status: Home / Self Care | Payer: Medicare Other | Source: Ambulatory Visit | Attending: Family | Admitting: Family

## 2021-12-02 DIAGNOSIS — Z1231 Encounter for screening mammogram for malignant neoplasm of breast: Secondary | ICD-10-CM | POA: Diagnosis not present

## 2021-12-02 NOTE — Telephone Encounter (Signed)
Call pt  Im sorry for any confusion I didn't change amlodipine  She is taking amlodipine combined with atorvastatin 5-'20mg'$ .   She should be taking amlodipine. Please refill caduet if needed.     BP Readings from Last 3 Encounters:  10/12/21 118/68  04/18/21 115/76  03/08/21 124/84

## 2021-12-02 NOTE — Telephone Encounter (Signed)
Spoke to patient in regards to her Amlodopine and she verbalized understanding

## 2022-02-08 ENCOUNTER — Other Ambulatory Visit: Payer: Self-pay | Admitting: Family

## 2022-04-08 ENCOUNTER — Other Ambulatory Visit: Payer: Self-pay | Admitting: Family

## 2022-04-08 DIAGNOSIS — E876 Hypokalemia: Secondary | ICD-10-CM

## 2022-04-08 DIAGNOSIS — I10 Essential (primary) hypertension: Secondary | ICD-10-CM

## 2022-04-12 ENCOUNTER — Telehealth: Payer: Self-pay | Admitting: Family

## 2022-04-12 NOTE — Telephone Encounter (Signed)
Contacted [place] to schedule their annual wellness visit. Appointment made for 04/20/2022.  Thank you,  [place] Direct dial  [phone]

## 2022-04-20 ENCOUNTER — Ambulatory Visit (INDEPENDENT_AMBULATORY_CARE_PROVIDER_SITE_OTHER): Payer: Medicare Other

## 2022-04-20 VITALS — BP 116/73 | HR 66 | Ht 63.0 in | Wt 171.0 lb

## 2022-04-20 DIAGNOSIS — Z0000 Encounter for general adult medical examination without abnormal findings: Secondary | ICD-10-CM | POA: Diagnosis not present

## 2022-04-20 NOTE — Patient Instructions (Addendum)
Valerie Larson , Thank you for taking time to come for your Medicare Wellness Visit. I appreciate your ongoing commitment to your health goals. Please review the following plan we discussed and let me know if I can assist you in the future.   These are the goals we discussed:  Goals       Patient Stated     Maintain Healthy Lifestyle (pt-stated)      Stay active. Healthy diet.        This is a list of the screening recommended for you and due dates:  Health Maintenance  Topic Date Due   DTaP/Tdap/Td vaccine (1 - Tdap) Never done   COVID-19 Vaccine (6 - 2023-24 season) 05/06/2022*   Flu Shot  05/07/2022*   Zoster (Shingles) Vaccine (2 of 2) 07/21/2022*   Medicare Annual Wellness Visit  04/20/2023   Mammogram  12/03/2023   Colon Cancer Screening  05/21/2024   Pneumonia Vaccine  Completed   DEXA scan (bone density measurement)  Completed   Hepatitis C Screening: USPSTF Recommendation to screen - Ages 3-79 yo.  Completed   HPV Vaccine  Aged Out  *Topic was postponed. The date shown is not the original due date.    Conditions/risks identified: none new  Next appointment: Follow up in one year for your annual wellness visit    Preventive Care 65 Years and Older, Female Preventive care refers to lifestyle choices and visits with your health care provider that can promote health and wellness. What does preventive care include? A yearly physical exam. This is also called an annual well check. Dental exams once or twice a year. Routine eye exams. Ask your health care provider how often you should have your eyes checked. Personal lifestyle choices, including: Daily care of your teeth and gums. Regular physical activity. Eating a healthy diet. Avoiding tobacco and drug use. Limiting alcohol use. Practicing safe sex. Taking low-dose aspirin every day. Taking vitamin and mineral supplements as recommended by your health care provider. What happens during an annual well check? The  services and screenings done by your health care provider during your annual well check will depend on your age, overall health, lifestyle risk factors, and family history of disease. Counseling  Your health care provider may ask you questions about your: Alcohol use. Tobacco use. Drug use. Emotional well-being. Home and relationship well-being. Sexual activity. Eating habits. History of falls. Memory and ability to understand (cognition). Work and work Statistician. Reproductive health. Screening  You may have the following tests or measurements: Height, weight, and BMI. Blood pressure. Lipid and cholesterol levels. These may be checked every 5 years, or more frequently if you are over 22 years old. Skin check. Lung cancer screening. You may have this screening every year starting at age 1 if you have a 30-pack-year history of smoking and currently smoke or have quit within the past 15 years. Fecal occult blood test (FOBT) of the stool. You may have this test every year starting at age 79. Flexible sigmoidoscopy or colonoscopy. You may have a sigmoidoscopy every 5 years or a colonoscopy every 10 years starting at age 52. Hepatitis C blood test. Hepatitis B blood test. Sexually transmitted disease (STD) testing. Diabetes screening. This is done by checking your blood sugar (glucose) after you have not eaten for a while (fasting). You may have this done every 1-3 years. Bone density scan. This is done to screen for osteoporosis. You may have this done starting at age 58. Mammogram. This may  be done every 1-2 years. Talk to your health care provider about how often you should have regular mammograms. Talk with your health care provider about your test results, treatment options, and if necessary, the need for more tests. Vaccines  Your health care provider may recommend certain vaccines, such as: Influenza vaccine. This is recommended every year. Tetanus, diphtheria, and acellular  pertussis (Tdap, Td) vaccine. You may need a Td booster every 10 years. Zoster vaccine. You may need this after age 39. Pneumococcal 13-valent conjugate (PCV13) vaccine. One dose is recommended after age 16. Pneumococcal polysaccharide (PPSV23) vaccine. One dose is recommended after age 72. Talk to your health care provider about which screenings and vaccines you need and how often you need them. This information is not intended to replace advice given to you by your health care provider. Make sure you discuss any questions you have with your health care provider. Document Released: 02/19/2015 Document Revised: 10/13/2015 Document Reviewed: 11/24/2014 Elsevier Interactive Patient Education  2017 [place] Prevention in the Home Falls can cause injuries. They can happen to people of all ages. There are many things you can do to make your home safe and to help prevent falls. What can I do on the outside of my home? Regularly fix the edges of walkways and driveways and fix any cracks. Remove anything that might make you trip as you walk through a door, such as a raised step or threshold. Trim any bushes or trees on the path to your home. Use bright outdoor lighting. Clear any walking paths of anything that might make someone trip, such as rocks or tools. Regularly check to see if handrails are loose or broken. Make sure that both sides of any steps have handrails. Any raised decks and porches should have guardrails on the edges. Have any leaves, snow, or ice cleared regularly. Use sand or salt on walking paths during winter. Clean up any spills in your garage right away. This includes oil or grease spills. What can I do in the bathroom? Use night lights. Install grab bars by the toilet and in the tub and shower. Do not use towel bars as grab bars. Use non-skid mats or decals in the tub or shower. If you need to sit down in the shower, use a plastic, non-slip stool. Keep the floor  dry. Clean up any water that spills on the floor as soon as it happens. Remove soap buildup in the tub or shower regularly. Attach bath mats securely with double-sided non-slip rug tape. Do not have throw rugs and other things on the floor that can make you trip. What can I do in the bedroom? Use night lights. Make sure that you have a light by your bed that is easy to reach. Do not use any sheets or blankets that are too big for your bed. They should not hang down onto the floor. Have a firm chair that has side arms. You can use this for support while you get dressed. Do not have throw rugs and other things on the floor that can make you trip. What can I do in the kitchen? Clean up any spills right away. Avoid walking on wet floors. Keep items that you use a lot in easy-to-reach places. If you need to reach something above you, use a strong step stool that has a grab bar. Keep electrical cords out of the way. Do not use floor polish or wax that makes floors slippery. If you must use  wax, use non-skid floor wax. Do not have throw rugs and other things on the floor that can make you trip. What can I do with my stairs? Do not leave any items on the stairs. Make sure that there are handrails on both sides of the stairs and use them. Fix handrails that are broken or loose. Make sure that handrails are as long as the stairways. Check any carpeting to make sure that it is firmly attached to the stairs. Fix any carpet that is loose or worn. Avoid having throw rugs at the top or bottom of the stairs. If you do have throw rugs, attach them to the floor with carpet tape. Make sure that you have a light switch at the top of the stairs and the bottom of the stairs. If you do not have them, ask someone to add them for you. What else can I do to help prevent falls? Wear shoes that: Do not have high heels. Have rubber bottoms. Are comfortable and fit you well. Are closed at the toe. Do not wear  sandals. If you use a stepladder: Make sure that it is fully opened. Do not climb a closed stepladder. Make sure that both sides of the stepladder are locked into place. Ask someone to hold it for you, if possible. Clearly mark and make sure that you can see: Any grab bars or handrails. First and last steps. Where the edge of each step is. Use tools that help you move around (mobility aids) if they are needed. These include: Canes. Walkers. Scooters. Crutches. Turn on the lights when you go into a dark area. Replace any light bulbs as soon as they burn out. Set up your furniture so you have a clear path. Avoid moving your furniture around. If any of your floors are uneven, fix them. If there are any pets around you, be aware of where they are. Review your medicines with your doctor. Some medicines can make you feel dizzy. This can increase your chance of falling. Ask your doctor what other things that you can do to help prevent falls. This information is not intended to replace advice given to you by your health care provider. Make sure you discuss any questions you have with your health care provider. Document Released: 11/19/2008 Document Revised: 07/01/2015 Document Reviewed: 02/27/2014 Elsevier Interactive Patient Education  2017 [employer].

## 2022-04-20 NOTE — Progress Notes (Signed)
Subjective:   Valerie Larson is a 67 y.o. female who presents for Medicare Annual (Subsequent) preventive examination.  Review of Systems    No ROS.  Medicare Wellness Virtual Visit.  Visual/audio telehealth visit, UTA vital signs.   See social history for additional risk factors.   Cardiac Risk Factors include: advanced age (>48mn, >>70women)     Objective:    Today's Vitals   04/20/22 1104  BP: 116/73  Pulse: 66  Weight: 171 lb (77.6 kg)  Height: '5\' 3"'$  (1.6 m)   Body mass index is 30.29 kg/m.     04/20/2022   11:07 AM 04/18/2021   11:31 AM 04/15/2020    9:14 AM 05/22/2019    8:09 AM 04/15/2019    9:25 AM  Advanced Directives  Does Patient Have a Medical Advance Directive? No No No No No  Would patient like information on creating a medical advance directive? No - Patient declined No - Patient declined No - Patient declined  No - Patient declined    Current Medications (verified) Outpatient Encounter Medications as of 04/20/2022  Medication Sig   albuterol (VENTOLIN HFA) 108 (90 Base) MCG/ACT inhaler TAKE 2 PUFFS BY MOUTH EVERY 6 HOURS AS NEEDED FOR WHEEZE OR SHORTNESS OF BREATH   amLODipine-atorvastatin (CADUET) 5-20 MG tablet TAKE 1 TABLET BY MOUTH EVERY DAY IN THE EVENING   Azelastine HCl 137 MCG/SPRAY SOLN PLACE 2 SPRAY(S) INTO BOTH NOSTRILS EVERY 12 HOURS AS NEEDED   hydrochlorothiazide (HYDRODIURIL) 25 MG tablet TAKE 1 TABLET BY MOUTH EVERY DAY   loratadine (CLARITIN) 10 MG tablet Take 10 mg by mouth daily.   Multiple Vitamins-Minerals (PRESERVISION AREDS 2+MULTI VIT PO) Take 1 tablet by mouth.   potassium chloride (KLOR-CON) 20 MEQ packet TAKE 20 MEQ BY MOUTH DAILY.   No facility-administered encounter medications on file as of 04/20/2022.    Allergies (verified) Chantix [varenicline]   History: Past Medical History:  Diagnosis Date   Chicken pox    Emphysema of lung ([place]    Hyperlipidemia    Hypertension    Past Surgical History:  Procedure  Laterality Date   ABDOMINAL HYSTERECTOMY  2000   complete hysterectomy, no ovaries. Done for noncancerous reasons , ovarian cyst. NO CERVIX on exam 10/19/20   APPENDECTOMY  1974   CHOLECYSTECTOMY     COLONOSCOPY WITH PROPOFOL N/A 05/22/2019   Procedure: COLONOSCOPY WITH PROPOFOL;  Surgeon: AJonathon Bellows MD;  [facility]  Service: Gastroenterology;  Laterality: N/A;   DIAGNOSTIC LAPAROSCOPY     GALLBLADDER SURGERY     OVARIAN CYST SURGERY  2004   TONSILLECTOMY     TONSILLECTOMY     Family History  Problem Relation Age of Onset   Cancer Mother    Hyperlipidemia Mother    Hypertension Mother    Ovarian cancer Mother [id number]  COPD Father    Diabetes Father    Hyperlipidemia Father    Hypertension Sister    Hypertension Brother    Social History   Socioeconomic History   Marital status: Married    Spouse name: Not on file   Number of children: Not on file   Years of education: Not on file   Highest education level: Not on file  Occupational History   Not on file  Tobacco Use   Smoking status: Former    Types: Cigarettes    Quit date: 11/15/2019    Years since quitting: 2.4   Smokeless tobacco: Never  Vaping Use  Vaping Use: Never used  Substance and Sexual Activity   Alcohol use: Never   Drug use: Never   Sexual activity: Not on file  Other Topics Concern   Not on file  Social History Narrative   Grandson 3 , diagnosed with leukemia at 2. He is doing well, but she still worries about him.    Social Determinants of Health   Financial Resource Strain: Low Risk  (04/20/2022)   Overall Financial Resource Strain (CARDIA)    Difficulty of Paying Living Expenses: Not hard at all  Food Insecurity: No Food Insecurity (04/20/2022)   Hunger Vital Sign    Worried About Running Out of Food in the Last Year: Never true    Ran Out of Food in the Last Year: Never true  Transportation Needs: No Transportation Needs (04/20/2022)   PRAPARE - Radiographer, therapeutic (Medical): No    Lack of Transportation (Non-Medical): No  Physical Activity: Not on file  Stress: No Stress Concern Present (04/20/2022)   [place]    Feeling of Stress : Not at all  Social Connections: Unknown (04/20/2022)   Social Connection and Isolation Panel [NHANES]    Frequency of Communication with Friends and Family: More than three times a week    Frequency of Social Gatherings with Friends and Family: More than three times a week    Attends Religious Services: Not on Advertising copywriter or Organizations: Not on file    Attends Archivist Meetings: Not on file    Marital Status: Married    Tobacco Counseling Counseling given: Not Answered   Clinical Intake:  Pre-visit preparation completed: Yes           How often do you need to have someone help you when you read instructions, pamphlets, or other written materials from your doctor or pharmacy?: 1 - Never    Interpreter Needed?: No      Activities of Daily Living    04/20/2022   11:08 AM  In your present state of health, do you have any difficulty performing the following activities:  Hearing? 0  Vision? 0  Difficulty concentrating or making decisions? 0  Walking or climbing stairs? 0  Dressing or bathing? 0  Doing errands, shopping? 0  Preparing Food and eating ? N  Using the Toilet? N  In the past six months, have you accidently leaked urine? N  Do you have problems with loss of bowel control? N  Managing your Medications? N  Managing your Finances? N  Housekeeping or managing your Housekeeping? N    Patient Care Team: Burnard Hawthorne, FNP as PCP - General (Family Medicine)  Indicate any recent Medical Services you may have received from other than [facility] providers in the past year (date may be approximate).     Assessment:   This is a routine wellness examination for Valerie Larson.  I  connected with  Valerie Larson on 04/20/22 by a audio enabled telemedicine application and verified that I am speaking with the correct person using two identifiers.  Patient Location: Home  Provider Location: Office/Clinic  I discussed the limitations of evaluation and management by telemedicine. The patient expressed understanding and agreed to proceed.   Hearing/Vision screen Hearing Screening - Comments:: Patient is able to hear conversational tones without difficulty. No issues reported. Vision Screening - Comments:: Followed by [facility]  Wears corrective lenses  They have seen their ophthalmologist in the last 12 months.    Dietary issues and exercise activities discussed: Current Exercise Habits: Home exercise routine, Type of exercise: walking, Intensity: Mild   Goals Addressed               This Visit's Progress     Patient Stated     Maintain Healthy Lifestyle (pt-stated)        Stay active. Healthy diet.       Depression Screen    04/20/2022   11:07 AM 04/18/2021    9:10 AM 04/21/2020    9:38 AM 04/15/2020    9:11 AM 04/15/2019    9:10 AM 01/20/2019    2:00 PM  PHQ 2/9 Scores  PHQ - 2 Score 0 0 0 0 0 0  PHQ- 9 Score   1       Fall Risk    04/20/2022   11:08 AM 04/18/2021    9:12 AM 04/15/2020    9:14 AM 04/15/2019    9:10 AM 01/20/2019    1:56 PM  Fall Risk   Falls in the past year? 0 0 0 0 0  Number falls in past yr: 0  0    Injury with Fall? 0  0    Follow up Falls evaluation completed;Falls prevention discussed Falls evaluation completed Falls evaluation completed Falls evaluation completed Falls evaluation completed    FALL RISK PREVENTION PERTAINING TO THE HOME: Home free of loose throw rugs in walkways, pet beds, electrical cords, etc? Yes  Adequate lighting in your home to reduce risk of falls? Yes   ASSISTIVE DEVICES UTILIZED TO PREVENT FALLS: Life alert? No  Use of a cane, walker or w/c? No   TIMED UP AND GO: Was the test  performed? No .   Cognitive Function:    04/15/2019    9:26 AM  MMSE - Mini Mental State Exam  Not completed: Unable to complete        04/20/2022   11:08 AM 04/15/2019    9:34 AM  6CIT Screen  What Year? 0 points 0 points  What month? 0 points 0 points  What time? 0 points 0 points  Count back from 20 0 points   Months in reverse 0 points   Repeat phrase 0 points   Total Score 0 points     Immunizations Immunization History  Administered Date(s) Administered   Fluad Quad(high Dose 65+) 02/12/2019, 12/09/2019, 10/19/2020   PFIZER Comirnaty(Gray Top)Covid-19 Tri-Sucrose Vaccine 03/17/2019, 04/05/2019   PFIZER(Purple Top)SARS-COV-2 Vaccination 03/17/2019, 04/05/2019, 02/04/2020   PNEUMOCOCCAL CONJUGATE-20 10/19/2020   Pneumococcal Polysaccharide-23 02/11/2007   Zoster Recombinat (Shingrix) 04/26/2021   Flu Vaccine status: Declined, Education has been provided regarding the importance of this vaccine but patient still declined. Advised may receive this vaccine at local pharmacy or Health Dept. Aware to provide a copy of the vaccination record if obtained from local pharmacy or Health Dept. Verbalized acceptance and understanding.  Covid-19 vaccine status: Completed vaccines x5.  Shingles vaccine- second vaccine not completed. Agrees to update immunization record.   Screening Tests Health Maintenance  Topic Date Due   DTaP/Tdap/Td (1 - Tdap) Never done   COVID-19 Vaccine (6 - 2023-24 season) 05/06/2022 (Originally 10/07/2021)   INFLUENZA VACCINE  05/07/2022 (Originally 09/06/2021)   Zoster Vaccines- Shingrix (2 of 2) 07/21/2022 (Originally 06/21/2021)   Medicare Annual Wellness (AWV)  04/20/2023   MAMMOGRAM  12/03/2023   COLONOSCOPY (Pts 45-57yr Insurance coverage will need to be confirmed)  05/21/2024   Pneumonia Vaccine 47+ Years old  Completed   DEXA SCAN  Completed   Hepatitis C Screening  Completed   HPV VACCINES  Aged Out    Health Maintenance Health Maintenance Due   Topic Date Due   DTaP/Tdap/Td (1 - Tdap) Never done   Lung Cancer Screening: (Low Dose CT Chest recommended if Age 12-80 years, 30 pack-year currently smoking OR have quit w/in 15years.) does not qualify.   Hepatitis C Screening: Completed 02/2015.  Vision Screening: Recommended annual ophthalmology exams for early detection of glaucoma and other disorders of the eye.  Dental Screening: Recommended annual dental exams for proper oral hygiene  Community Resource Referral / Chronic Care Management: CRR required this visit?  No   CCM required this visit?  No      Plan:     I have personally reviewed and noted the following in the patient's chart:   Medical and social history Use of alcohol, tobacco or illicit drugs  Current medications and supplements including opioid prescriptions. Patient is not currently taking opioid prescriptions. Functional ability and status Nutritional status Physical activity Advanced directives List of other physicians Hospitalizations, surgeries, and ER visits in previous 12 months Vitals Screenings to include cognitive, depression, and falls Referrals and appointments  In addition, I have reviewed and discussed with patient certain preventive protocols, quality metrics, and best practice recommendations. A written personalized care plan for preventive services as well as general preventive health recommendations were provided to patient.     Leta Jungling, LPN   [id number]

## 2022-07-27 DIAGNOSIS — H353132 Nonexudative age-related macular degeneration, bilateral, intermediate dry stage: Secondary | ICD-10-CM | POA: Diagnosis not present

## 2022-07-27 DIAGNOSIS — H2513 Age-related nuclear cataract, bilateral: Secondary | ICD-10-CM | POA: Diagnosis not present

## 2022-08-05 ENCOUNTER — Other Ambulatory Visit: Payer: Self-pay | Admitting: Family

## 2022-08-05 DIAGNOSIS — E785 Hyperlipidemia, unspecified: Secondary | ICD-10-CM

## 2022-08-05 DIAGNOSIS — I10 Essential (primary) hypertension: Secondary | ICD-10-CM

## 2022-11-08 ENCOUNTER — Other Ambulatory Visit: Payer: Self-pay | Admitting: Family

## 2022-11-08 DIAGNOSIS — I10 Essential (primary) hypertension: Secondary | ICD-10-CM

## 2022-11-08 DIAGNOSIS — E785 Hyperlipidemia, unspecified: Secondary | ICD-10-CM

## 2022-11-13 NOTE — Telephone Encounter (Signed)
LVM to call back to schedule f/up appt courtesy refill sent in to pharmacy

## 2022-11-24 ENCOUNTER — Encounter: Payer: Self-pay | Admitting: Family

## 2022-11-24 ENCOUNTER — Ambulatory Visit (INDEPENDENT_AMBULATORY_CARE_PROVIDER_SITE_OTHER): Payer: Medicare Other | Admitting: Family

## 2022-11-24 VITALS — BP 116/72 | HR 81 | Temp 98.0°F | Ht 63.0 in | Wt 168.8 lb

## 2022-11-24 DIAGNOSIS — E785 Hyperlipidemia, unspecified: Secondary | ICD-10-CM

## 2022-11-24 DIAGNOSIS — M545 Low back pain, unspecified: Secondary | ICD-10-CM | POA: Diagnosis not present

## 2022-11-24 DIAGNOSIS — Z716 Tobacco abuse counseling: Secondary | ICD-10-CM | POA: Diagnosis not present

## 2022-11-24 DIAGNOSIS — Z780 Asymptomatic menopausal state: Secondary | ICD-10-CM

## 2022-11-24 DIAGNOSIS — Z9109 Other allergy status, other than to drugs and biological substances: Secondary | ICD-10-CM

## 2022-11-24 DIAGNOSIS — R7303 Prediabetes: Secondary | ICD-10-CM

## 2022-11-24 DIAGNOSIS — I10 Essential (primary) hypertension: Secondary | ICD-10-CM

## 2022-11-24 DIAGNOSIS — Z136 Encounter for screening for cardiovascular disorders: Secondary | ICD-10-CM | POA: Diagnosis not present

## 2022-11-24 DIAGNOSIS — Z87898 Personal history of other specified conditions: Secondary | ICD-10-CM

## 2022-11-24 DIAGNOSIS — Z8639 Personal history of other endocrine, nutritional and metabolic disease: Secondary | ICD-10-CM

## 2022-11-24 DIAGNOSIS — G8929 Other chronic pain: Secondary | ICD-10-CM | POA: Diagnosis not present

## 2022-11-24 DIAGNOSIS — M8580 Other specified disorders of bone density and structure, unspecified site: Secondary | ICD-10-CM

## 2022-11-24 DIAGNOSIS — Z13220 Encounter for screening for lipoid disorders: Secondary | ICD-10-CM

## 2022-11-24 LAB — COMPREHENSIVE METABOLIC PANEL
ALT: 11 U/L (ref 0–35)
AST: 18 U/L (ref 0–37)
Albumin: 3.9 g/dL (ref 3.5–5.2)
Alkaline Phosphatase: 64 U/L (ref 39–117)
BUN: 12 mg/dL (ref 6–23)
CO2: 33 meq/L — ABNORMAL HIGH (ref 19–32)
Calcium: 10.1 mg/dL (ref 8.4–10.5)
Chloride: 102 meq/L (ref 96–112)
Creatinine, Ser: 0.92 mg/dL (ref 0.40–1.20)
GFR: 63.44 mL/min (ref >60.00)
Glucose, Bld: 103 mg/dL — ABNORMAL HIGH (ref 70–99)
Potassium: 3.4 meq/L — ABNORMAL LOW (ref 3.5–5.1)
Sodium: 141 meq/L (ref 135–145)
Total Bilirubin: 1.2 mg/dL (ref 0.2–1.2)
Total Protein: 7.1 g/dL (ref 6.0–8.3)

## 2022-11-24 LAB — CBC WITH DIFFERENTIAL/PLATELET
Basophils Absolute: 0.1 10*3/uL (ref 0.0–0.1)
Basophils Relative: 1.1 % (ref 0.0–3.0)
Eosinophils Absolute: 0.1 10*3/uL (ref 0.0–0.7)
Eosinophils Relative: 1.8 % (ref 0.0–5.0)
HCT: 44.1 % (ref 36.0–46.0)
Hemoglobin: 14.6 g/dL (ref 12.0–15.0)
Lymphocytes Relative: 35 % (ref 12.0–46.0)
Lymphs Abs: 2.6 10*3/uL (ref 0.7–4.0)
MCHC: 33.2 g/dL (ref 30.0–36.0)
MCV: 93.3 fL (ref 78.0–100.0)
Monocytes Absolute: 0.6 10*3/uL (ref 0.1–1.0)
Monocytes Relative: 7.8 % (ref 3.0–12.0)
Neutro Abs: 4.1 10*3/uL (ref 1.4–7.7)
Neutrophils Relative %: 54.3 % (ref 43.0–77.0)
Platelets: 313 10*3/uL (ref 150.0–400.0)
RBC: 4.72 Mil/uL (ref 3.87–5.11)
RDW: 13.3 % (ref 11.5–15.5)
WBC: 7.5 10*3/uL (ref 4.0–10.5)

## 2022-11-24 LAB — LIPID PANEL
Cholesterol: 135 mg/dL (ref 0–200)
HDL: 39.4 mg/dL (ref >39.00)
LDL Cholesterol: 65 mg/dL (ref 0–99)
NonHDL: 95.75
Total CHOL/HDL Ratio: 3
Triglycerides: 156 mg/dL — ABNORMAL HIGH (ref 0.0–149.0)
VLDL: 31.2 mg/dL (ref 0.0–40.0)

## 2022-11-24 LAB — TSH: TSH: 4.22 u[IU]/mL (ref 0.35–5.50)

## 2022-11-24 LAB — HEMOGLOBIN A1C: Hgb A1c MFr Bld: 6 % (ref 4.6–6.5)

## 2022-11-24 MED ORDER — ALBUTEROL SULFATE HFA 108 (90 BASE) MCG/ACT IN AERS
INHALATION_SPRAY | RESPIRATORY_TRACT | 1 refills | Status: DC
Start: 2022-11-24 — End: 2023-01-16

## 2022-11-24 MED ORDER — MELOXICAM 7.5 MG PO TABS
7.5000 mg | ORAL_TABLET | Freq: Every day | ORAL | 2 refills | Status: AC | PRN
Start: 2022-11-24 — End: ?

## 2022-11-24 MED ORDER — AZELASTINE HCL 137 MCG/SPRAY NA SOLN
NASAL | 4 refills | Status: DC
Start: 2022-11-24 — End: 2023-02-20

## 2022-11-24 MED ORDER — AMLODIPINE-ATORVASTATIN 5-20 MG PO TABS: 1.0000 | ORAL_TABLET | Freq: Every evening | ORAL | 3 refills | Status: DC

## 2022-11-24 MED ORDER — HYDROCHLOROTHIAZIDE 25 MG PO TABS: 25.0000 mg | ORAL_TABLET | Freq: Every day | ORAL | 3 refills | Status: DC

## 2022-11-24 NOTE — Assessment & Plan Note (Signed)
Chronic, stable.  Continue amlodipine 5 mg, hydrochlorothiazide 25 mg, potassium chloride 20 meq qod.

## 2022-11-24 NOTE — Patient Instructions (Addendum)
Please schedule bone density.   Please let me know if you would like to see physical therapy .  trial mobic which is a potent anti-inflammatory daily for 2 weeks.  As this medication can upset your stomach, please take with food.   After 2 weeks, I would stop the medication and use as needed.  As discussed, you may also take Tylenol arthritis 650 mg tablet once daily.  You may take this while taking meloxicam  A couple of points in regards to meloxicam ( Mobic) -  This medication is not intended for daily , long term use. It is a potent anti inflammatory ( NSAID), and my intention is for you take as needed for moderate to severe pain. If you find yourself using daily, please let me know.   Please takes Mobic ( meloxicam) with FOOD since it is an anti-inflammatory as it can cause a GI bleed or ulcer. If you have a history of GI bleed or ulcer, please do NOT take.  Do no take over the counter aleve, motrin, advil, goody's powder for pain as they are also NSAIDs, and they are  in the same class as Mobic  Lastly, we will need to monitor kidney function while on Mobic, and if we were to see any decline in kidney function in the future, we would have to discontinue this medication.    Sacroiliac Joint Dysfunction  Sacroiliac joint dysfunction is a condition that causes inflammation on one or both sides of the sacroiliac (SI) joint. The SI joint is the joint between two bones of the pelvis called the sacrum and the ilium. The sacrum is the bone at the base of the spine. The ilium is the large bone that forms the hip. This condition causes deep aching or burning pain in the low back. In some cases, the pain may also spread into one or both buttocks, hips, or thighs. What are the causes? This condition may be caused by: Pregnancy. During pregnancy, extra stress is put on the SI joints because the pelvis widens. Injury, such as: Injuries from car crashes. Sports-related injuries. Work-related  injuries. Having one leg that is shorter than the other. Conditions that affect the joints, such as: Rheumatoid arthritis. Gout. Psoriatic arthritis. Joint infection (septic arthritis). Sometimes, the cause of SI joint dysfunction is not known. What are the signs or symptoms? Symptoms of this condition include: Aching or burning pain in the lower back. The pain may also spread to other areas, such as: Buttocks. Groin. Thighs. Muscle spasms in or around the painful areas. Increased pain when standing, walking, running, stair climbing, bending, or lifting. How is this diagnosed? This condition is diagnosed with a physical exam and your medical history. During the exam, the health care provider may move one or both of your legs to different positions to check for pain. Various tests may be done to confirm the diagnosis, including: Imaging tests to look for other causes of pain. These may include: MRI. CT scan. Bone scan. Diagnostic injection. A numbing medicine is injected into the SI joint using a needle. If your pain is temporarily improved or stopped after the injection, this can indicate that SI joint dysfunction is the problem. How is this treated? Treatment depends on the cause and severity of your condition. Treatment options can be noninvasive and may include: Ice or heat applied to the lower back area after an injury. This may help reduce pain and muscle spasms. Medicines to relieve pain or inflammation or to  relax the muscles. Wearing a back brace (sacroiliac brace) to help support the joint while your back is healing. Physical therapy to increase muscle strength around the joint and flexibility at the joint. This may also involve learning proper body positions and ways of moving to relieve stress on the joint. Direct manipulation of the SI joint. Use of a device that provides electrical stimulation to help reduce pain at the joint. Other treatments may include: Injections of  steroid medicine into the joint to reduce pain and swelling. Radiofrequency ablation. This treatment uses heat to burn away nerves that are carrying pain messages from the joint. Surgery to put in screws and plates that limit or prevent joint motion. This is rare. Follow these instructions at home: Medicines Take over-the-counter and prescription medicines only as told by your health care provider. Ask your health care provider if the medicine prescribed to you: Requires you to avoid driving or using machinery. Can cause constipation. You may need to take these actions to prevent or treat constipation: Drink enough fluid to keep your urine pale yellow. Take over-the-counter or prescription medicines. Eat foods that are high in fiber, such as beans, whole grains, and fresh fruits and vegetables. Limit foods that are high in fat and processed sugars, such as fried or sweet foods. If you have a brace: Wear the brace as told by your health care provider. Remove it only as told by your health care provider. Keep the brace clean. If the brace is not waterproof: Do not let it get wet. Cover it with a watertight covering when you take a bath or a shower. Managing pain, stiffness, and swelling     Icing can help with pain and swelling. Heat may help with muscle tension or spasms. Ask your health care provider if you should use ice or heat. If directed, put ice on the affected area: If you have a removable brace, remove it as told by your health care provider. Put ice in a plastic bag. Place a towel between your skin and the bag. Leave the ice on for 20 minutes, 2-3 times a day. Remove the ice if your skin turns bright red. This is very important. If you cannot feel pain, heat, or cold, you have a greater risk of damage to the area. If directed, apply heat to the affected area as often as told by your health care provider. Use the heat source that your health care provider recommends, such as a  moist heat pack or a heating pad. Place a towel between your skin and the heat source. Leave the heat on for 20-30 minutes. Remove the heat if your skin turns bright red. This is especially important if you are unable to feel pain, heat, or cold. You may have a greater risk of getting burned. General instructions Rest as needed. Return to your normal activities as told by your health care provider. Ask your health care provider what activities are safe for you. Do exercises as told by your health care provider or physical therapist. Keep all follow-up visits. This is important. Contact a health care provider if: Your pain is not controlled with medicine. You have a fever. Your pain is getting worse. Get help right away if: You have weakness, numbness, or tingling in your legs or feet. You lose control of your bladder or bowels. Summary Sacroiliac (SI) joint dysfunction is a condition that causes inflammation on one or both sides of the SI joint. This condition causes deep aching or  burning pain in the low back. In some cases, the pain may also spread into one or both buttocks, hips, or thighs. Treatment depends on the cause and severity of your condition. It may include medicines to reduce pain and swelling or to relax muscles. This information is not intended to replace advice given to you by your health care provider. Make sure you discuss any questions you have with your health care provider. Document Revised: 06/05/2019 Document Reviewed: 06/05/2019 Elsevier Patient Education  2024 [employer] Hip Bursitis Rehab Ask your health care provider which exercises are safe for you. Do exercises exactly as told by your health care provider and adjust them as directed. It is normal to feel mild stretching, pulling, tightness, or discomfort as you do these exercises. Stop right away if you feel sudden pain or your pain gets worse. Do not begin these exercises until told by your health care  provider. Stretching exercise This exercise warms up your muscles and joints and improves the movement and flexibility of your hip. This exercise also helps to relieve pain and stiffness. Iliotibial band stretch An iliotibial band is a strong band of muscle tissue that runs from the outer side of your hip to the outer side of your thigh and knee. Lie on your side with your left / right leg in the top position. Bend your left / right knee and grab your ankle. Stretch out your bottom arm to help you balance. Slowly bring your knee back so your thigh is slightly behind your body. Slowly lower your knee toward the floor until you feel a gentle stretch on the outside of your left / right thigh. If you do not feel a stretch and your knee will not lower more toward the floor, place the heel of your other foot on top of your knee and pull your knee down toward the floor with your foot. Hold this position for __________ seconds. Slowly return to the starting position. Repeat __________ times. Complete this exercise __________ times a day. Strengthening exercises These exercises build strength and endurance in your hip and pelvis. Endurance is the ability to use your muscles for a long time, even after they get tired. Bridge This exercise strengthens the muscles that move your thigh backward (hip extensors). Lie on your back on a firm surface with your knees bent and your feet flat on the floor. Tighten your buttocks muscles and lift your buttocks off the floor until your trunk is level with your thighs. Do not arch your back. You should feel the muscles working in your buttocks and the back of your thighs. If you do not feel these muscles, slide your feet 1-2 inches (2.5-5 cm) farther away from your buttocks. If this exercise is too easy, try doing it with your arms crossed over your chest. Hold this position for __________ seconds. Slowly lower your hips to the starting position. Let your muscles  relax completely after each repetition. Repeat __________ times. Complete this exercise __________ times a day. Squats This exercise strengthens the muscles in front of your thigh and knee (quadriceps). Stand in front of a table, with your feet and knees pointing straight ahead. You may rest your hands on the table for balance but not for support. Slowly bend your knees and lower your hips like you are going to sit in a chair. Keep your weight over your heels, not over your toes. Keep your lower legs upright so they are parallel with the table legs. Do not  let your hips go lower than your knees. Do not bend lower than told by your health care provider. If your hip pain increases, do not bend as low. Hold the squat position for __________ seconds. Slowly push with your legs to return to standing. Do not use your hands to pull yourself to standing. Repeat __________ times. Complete this exercise __________ times a day. Hip hike  Stand sideways on a bottom step. Stand on your left / right leg with your other foot unsupported next to the step. You can hold on to the railing or wall for balance if needed. Keep your knees straight and your torso square. Then lift your left / right hip up toward the ceiling. Hold this position for __________ seconds. Slowly let your left / right hip lower toward the floor, past the starting position. Your foot should get closer to the floor. Do not lean or bend your knees. Repeat __________ times. Complete this exercise __________ times a day. Single leg stand This exercise increases your balance. Without shoes, stand near a railing or in a doorway. You may hold on to the railing or door frame as needed for balance. Squeeze your left / right buttock muscles, then lift up your other foot. Do not let your left / right hip push out to the side. It is helpful to stand in front of a mirror for this exercise so you can watch your hip. Hold this position for __________  seconds. Repeat __________ times. Complete this exercise __________ times a day. This information is not intended to replace advice given to you by your health care provider. Make sure you discuss any questions you have with your health care provider. Document Revised: 01/05/2021 Document Reviewed: 01/05/2021 Elsevier Patient Education  2024 [employer].

## 2022-11-24 NOTE — Assessment & Plan Note (Addendum)
Presentation consistent with sacroiliac joint pain, question if trochanteric bursitis is also playing a role.  Provided patient with short-term prescription for meloxicam to take as needed for pain.  Counseled on how to use Tylenol arthritis.  Pending hip and lumbar x-rays.  Provided home exercises.  Patient will consider physical therapy.

## 2022-11-24 NOTE — Progress Notes (Signed)
Assessment & Plan:  Chronic bilateral low back pain without sciatica Assessment & Plan: Presentation consistent with sacroiliac joint pain, question if trochanteric bursitis is also playing a role.  Provided patient with short-term prescription for meloxicam to take as needed for pain.  Counseled on how to use Tylenol arthritis.  Pending hip and lumbar x-rays.  Provided home exercises.  Patient will consider physical therapy.   Orders: -     DG HIPS BILAT W OR W/O PELVIS 3-4 VIEWS; Future -     DG Lumbar Spine Complete; Future -     Meloxicam; Take 1 tablet (7.5 mg total) by mouth daily as needed for pain.  Dispense: 30 tablet; Refill: 2  Encounter for lipid screening for cardiovascular disease -     Lipid panel  History of vitamin D deficiency  Essential hypertension Assessment & Plan: Chronic, stable.  Continue amlodipine 5 mg, hydrochlorothiazide 25 mg, potassium chloride 20 meq qod.  Orders: -     CBC with Differential/Platelet -     Comprehensive metabolic panel -     TSH -     amLODIPine-Atorvastatin; Take 1 tablet by mouth every evening.  Dispense: 90 tablet; Refill: 3  History of prediabetes -     Hemoglobin A1c  Encounter for smoking cessation counseling  Environmental allergies -     Albuterol Sulfate HFA; TAKE 2 PUFFS BY MOUTH EVERY 6 HOURS AS NEEDED FOR WHEEZE OR SHORTNESS OF BREATH  Dispense: 8.5 each; Refill: 1 -     Azelastine HCl; PLACE 2 SPRAY(S) INTO BOTH NOSTRILS EVERY 12 HOURS AS NEEDED  Dispense: 30 mL; Refill: 4  Osteopenia, unspecified location  Prediabetes -     Hemoglobin A1c  Asymptomatic postmenopausal state -     DG Bone Density; Future  Hyperlipidemia, unspecified hyperlipidemia type -     amLODIPine-Atorvastatin; Take 1 tablet by mouth every evening.  Dispense: 90 tablet; Refill: 3  Other orders -     hydroCHLOROthiazide; Take 1 tablet (25 mg total) by mouth daily.  Dispense: 90 tablet; Refill: 3     Return precautions given.    Risks, benefits, and alternatives of the medications and treatment plan prescribed today were discussed, and patient expressed understanding.   Education regarding symptom management and diagnosis given to patient on AVS either electronically or printed.  Return in about 6 months (around 05/25/2023).  Rennie Plowman, FNP  Subjective:    Patient ID: Valerie Larson, female    DOB: [birth year], 67 y.o.   MRN: [id number]  CC: Valerie Larson is a 67 y.o. female who presents today for follow up.   HPI: Complains of low back pain , lateral hip pain  'forever' for 5-6 years, worsening  Hard to sit or stand for long periods of time.   No numbness, groin pain, calf pain with walking. No trouble urinating or having a bowel movement.    Used icy hot, ibuprofen with temporarily relief.      She is compliant with amlodipine 5 mg, HCTZ 25 mg.  She remains compliant with atorvastatin 40 mg daily  Allergies: Chantix [varenicline] Current Outpatient Medications on File Prior to Visit  Medication Sig Dispense Refill   loratadine (CLARITIN) 10 MG tablet Take 10 mg by mouth daily.     Multiple Vitamins-Minerals (PRESERVISION AREDS 2+MULTI VIT PO) Take 1 tablet by mouth.     potassium chloride (KLOR-CON) 20 MEQ packet TAKE 20 MEQ BY MOUTH DAILY. 90 packet 1   No current facility-administered  medications on file prior to visit.    Review of Systems  Constitutional:  Negative for chills and fever.  Respiratory:  Negative for cough.   Cardiovascular:  Negative for chest pain and palpitations.  Gastrointestinal:  Negative for nausea and vomiting.  Musculoskeletal:  Positive for back pain.  Neurological:  Negative for numbness.      Objective:    BP 116/72   Pulse 81   Temp 98 F (36.7 C) (Oral)   Ht 5\' 3"  (1.6 m)   Wt 168 lb 12.8 oz (76.6 kg)   SpO2 97%   BMI 29.90 kg/m  BP Readings from Last 3 Encounters:  11/24/22 116/72  04/20/22 116/73  10/12/21 118/68   Wt Readings from  Last 3 Encounters:  11/24/22 168 lb 12.8 oz (76.6 kg)  04/20/22 171 lb (77.6 kg)  10/12/21 171 lb 6.4 oz (77.7 kg)    Physical Exam Vitals reviewed.  Constitutional:      Appearance: She is well-developed.  Eyes:     Conjunctiva/sclera: Conjunctivae normal.  Cardiovascular:     Rate and Rhythm: Normal rate and regular rhythm.     Pulses: Normal pulses.     Heart sounds: Normal heart sounds.  Pulmonary:     Effort: Pulmonary effort is normal.     Breath sounds: Normal breath sounds. No wheezing, rhonchi or rales.  Musculoskeletal:     Lumbar back: No swelling, edema, spasms, tenderness or bony tenderness. Normal range of motion. Negative right straight leg raise test and negative left straight leg raise test.       Back:     Comments: Full range of motion with flexion, tension, lateral side bends.  Tenderness over bilateral SI joints.   No pain, numbness, tingling elicited with single leg raise bilaterally.  Bilateral Hips: No limp or waddling gait. Full ROM with flexion and hip rotation in flexion.    No pain of lateral hip with  (flexion-abduction-external rotation) test.   No pain with deep palpation of greater trochanter.    Skin:    General: Skin is warm and dry.  Neurological:     Mental Status: She is alert.     Sensory: No sensory deficit.     Deep Tendon Reflexes:     Reflex Scores:      Patellar reflexes are 2+ on the right side and 2+ on the left side.    Comments: Sensation and strength intact bilateral lower extremities.  Psychiatric:        Speech: Speech normal.        Behavior: Behavior normal.        Thought Content: Thought content normal.

## 2022-11-27 ENCOUNTER — Other Ambulatory Visit: Payer: Self-pay | Admitting: Family

## 2022-11-27 ENCOUNTER — Other Ambulatory Visit: Payer: Self-pay | Admitting: *Deleted

## 2022-11-27 DIAGNOSIS — E876 Hypokalemia: Secondary | ICD-10-CM

## 2022-11-27 DIAGNOSIS — I10 Essential (primary) hypertension: Secondary | ICD-10-CM

## 2022-11-27 MED ORDER — TRIAMTERENE-HCTZ 37.5-25 MG PO TABS
1.0000 | ORAL_TABLET | Freq: Every day | ORAL | 3 refills | Status: DC
Start: 2022-11-27 — End: 2023-10-26

## 2022-11-28 ENCOUNTER — Ambulatory Visit: Payer: Medicare Other

## 2022-11-28 ENCOUNTER — Telehealth: Payer: Self-pay | Admitting: Family

## 2022-11-28 ENCOUNTER — Other Ambulatory Visit: Payer: Medicare Other

## 2022-11-28 DIAGNOSIS — M47816 Spondylosis without myelopathy or radiculopathy, lumbar region: Secondary | ICD-10-CM | POA: Diagnosis not present

## 2022-11-28 DIAGNOSIS — M25552 Pain in left hip: Secondary | ICD-10-CM | POA: Diagnosis not present

## 2022-11-28 DIAGNOSIS — G8929 Other chronic pain: Secondary | ICD-10-CM

## 2022-11-28 DIAGNOSIS — M545 Low back pain, unspecified: Secondary | ICD-10-CM | POA: Diagnosis not present

## 2022-11-28 DIAGNOSIS — M25551 Pain in right hip: Secondary | ICD-10-CM | POA: Diagnosis not present

## 2022-11-28 NOTE — Telephone Encounter (Signed)
Pt called stating she just picked up the triamterene-hydrochlorothiazide and wan to know if she need to take the amlodipine with it

## 2022-11-28 NOTE — Telephone Encounter (Signed)
Call pt  Yes it is okay to take amlodipine with triamterene hydrochlorothiazide.    Please ensure that she is no longer on potassium chloride or if the plain version of hydrochlorothiazide.see result note from 11/27/22

## 2022-11-28 NOTE — Telephone Encounter (Signed)
Spoke to pt and informed her that  it is okay to take amlodipine with triamterene hydrochlorothiazide.     Pt stated that she is no longer on potassium chloride

## 2022-12-06 ENCOUNTER — Other Ambulatory Visit (INDEPENDENT_AMBULATORY_CARE_PROVIDER_SITE_OTHER): Payer: Medicare Other

## 2022-12-06 DIAGNOSIS — E876 Hypokalemia: Secondary | ICD-10-CM | POA: Diagnosis not present

## 2022-12-06 LAB — BASIC METABOLIC PANEL
BUN: 13 mg/dL (ref 6–23)
CO2: 32 meq/L (ref 19–32)
Calcium: 9.9 mg/dL (ref 8.4–10.5)
Chloride: 104 meq/L (ref 96–112)
Creatinine, Ser: 0.99 mg/dL (ref 0.40–1.20)
GFR: 58.08 mL/min — ABNORMAL LOW (ref >60.00)
Glucose, Bld: 74 mg/dL (ref 70–99)
Potassium: 3.5 meq/L (ref 3.5–5.1)
Sodium: 141 meq/L (ref 135–145)

## 2022-12-11 ENCOUNTER — Other Ambulatory Visit: Payer: Self-pay | Admitting: Family

## 2022-12-11 DIAGNOSIS — Z1231 Encounter for screening mammogram for malignant neoplasm of breast: Secondary | ICD-10-CM

## 2022-12-28 ENCOUNTER — Ambulatory Visit
Admission: RE | Admit: 2022-12-28 | Discharge: 2022-12-28 | Disposition: A | Discharge status: Home / Self Care | Payer: Medicare Other | Source: Ambulatory Visit | Attending: Family | Admitting: Family

## 2022-12-28 DIAGNOSIS — Z1231 Encounter for screening mammogram for malignant neoplasm of breast: Secondary | ICD-10-CM | POA: Insufficient documentation

## 2023-01-16 ENCOUNTER — Other Ambulatory Visit: Payer: Self-pay | Admitting: Family

## 2023-01-16 DIAGNOSIS — Z9109 Other allergy status, other than to drugs and biological substances: Secondary | ICD-10-CM

## 2023-02-20 ENCOUNTER — Other Ambulatory Visit: Payer: Self-pay | Admitting: Family

## 2023-02-20 DIAGNOSIS — Z9109 Other allergy status, other than to drugs and biological substances: Secondary | ICD-10-CM

## 2023-03-20 ENCOUNTER — Other Ambulatory Visit: Payer: Self-pay | Admitting: Family

## 2023-03-20 DIAGNOSIS — Z9109 Other allergy status, other than to drugs and biological substances: Secondary | ICD-10-CM

## 2023-04-20 ENCOUNTER — Other Ambulatory Visit: Payer: Self-pay | Admitting: Family

## 2023-04-20 DIAGNOSIS — Z780 Asymptomatic menopausal state: Secondary | ICD-10-CM

## 2023-05-01 ENCOUNTER — Ambulatory Visit (INDEPENDENT_AMBULATORY_CARE_PROVIDER_SITE_OTHER)

## 2023-05-01 VITALS — BP 114/82 | HR 74 | Ht 63.0 in | Wt 168.0 lb

## 2023-05-01 DIAGNOSIS — Z0000 Encounter for general adult medical examination without abnormal findings: Secondary | ICD-10-CM

## 2023-05-01 NOTE — Progress Notes (Signed)
 Subjective:   Valerie Larson is a 68 y.o. who presents for a Medicare Wellness preventive visit.  Visit Complete: Virtual I connected with  [name] on 05/01/23 by a audio enabled telemedicine application and verified that I am speaking with the correct person using two identifiers.  Patient Location: Home  Provider Location: Home Office  I discussed the limitations of evaluation and management by telemedicine. The patient expressed understanding and agreed to proceed.  Vital Signs: Because this visit was a virtual/telehealth visit, some criteria may be missing or patient reported. Any vitals not documented were not able to be obtained and vitals that have been documented are patient reported.  VideoDeclined- This patient declined Librarian, academic. Therefore the visit was completed with audio only.  Persons Participating in Visit: Patient.  AWV Questionnaire: No: Patient Medicare AWV questionnaire was not completed prior to this visit.  Cardiac Risk Factors include: advanced age (>34men, >79 women);hypertension     Objective:    Today's Vitals   05/01/23 0811  BP: 114/82  Pulse: 74  Weight: 168 lb (76.2 kg)  Height: 5\' 3"  (1.6 m)   Body mass index is 29.76 kg/m.     05/01/2023    8:25 AM 04/20/2022   11:07 AM 04/18/2021   11:31 AM 04/15/2020    9:14 AM 05/22/2019    8:09 AM 04/15/2019    9:25 AM  Advanced Directives  Does Patient Have a Medical Advance Directive? No No No No No No  Would patient like information on creating a medical advance directive?  No - Patient declined No - Patient declined No - Patient declined  No - Patient declined    Current Medications (verified) Outpatient Encounter Medications as of 05/01/2023  Medication Sig   albuterol (VENTOLIN HFA) 108 (90 Base) MCG/ACT inhaler TAKE 2 PUFFS BY MOUTH EVERY 6 HOURS AS NEEDED FOR WHEEZE OR SHORTNESS OF BREATH   amLODipine-atorvastatin (CADUET) 5-20 MG tablet Take 1  tablet by mouth every evening.   Azelastine HCl 137 MCG/SPRAY SOLN PLACE 2 SPRAY(S) INTO BOTH NOSTRILS EVERY 12 HOURS AS NEEDED   loratadine (CLARITIN) 10 MG tablet Take 10 mg by mouth daily.   Multiple Vitamins-Minerals (PRESERVISION AREDS 2+MULTI VIT PO) Take 1 tablet by mouth.   triamterene-hydrochlorothiazide (MAXZIDE-25) 37.5-25 MG tablet Take 1 tablet by mouth daily.   meloxicam (MOBIC) 7.5 MG tablet Take 1 tablet (7.5 mg total) by mouth daily as needed for pain. (Patient not taking: Reported on 05/01/2023)   No facility-administered encounter medications on file as of 05/01/2023.    Allergies (verified) Chantix [varenicline]   History: Past Medical History:  Diagnosis Date   Chicken pox    Emphysema of lung (HCC)    Hyperlipidemia    Hypertension    Past Surgical History:  Procedure Laterality Date   ABDOMINAL HYSTERECTOMY  2000   complete hysterectomy, no ovaries. Done for noncancerous reasons , ovarian cyst. NO CERVIX on exam 10/19/20   APPENDECTOMY  1974   CHOLECYSTECTOMY     COLONOSCOPY WITH PROPOFOL N/A 05/22/2019   Procedure: COLONOSCOPY WITH PROPOFOL;  Surgeon: Wyline Mood, MD;  Location: [facility] ENDOSCOPY;  Service: Gastroenterology;  Laterality: N/A;   DIAGNOSTIC LAPAROSCOPY     GALLBLADDER SURGERY     OVARIAN CYST SURGERY  2004   TONSILLECTOMY     TONSILLECTOMY     Family History  Problem Relation Age of Onset   Cancer Mother    Hyperlipidemia Mother    Hypertension Mother  Ovarian cancer Mother 71   COPD Father    Diabetes Father    Hyperlipidemia Father    Hypertension Sister    Hypertension Brother    Colon cancer Neg Hx    Aneurysm Neg Hx    Social History   Socioeconomic History   Marital status: Married    Spouse name: Not on file   Number of children: Not on file   Years of education: Not on file   Highest education level: Not on file  Occupational History   Not on file  Tobacco Use   Smoking status: Former    Current packs/day: 0.00     Types: Cigarettes    Quit date: 11/15/2019    Years since quitting: 3.4   Smokeless tobacco: Never  Vaping Use   Vaping status: Never Used  Substance and Sexual Activity   Alcohol use: Never   Drug use: Never   Sexual activity: Not on file  Other Topics Concern   Not on file  Social History Narrative   Grandson 3 , diagnosed with leukemia at 2. He is doing well, but she still worries about him.    Social Drivers of Corporate investment banker Strain: Low Risk  (05/01/2023)   Overall Financial Resource Strain (CARDIA)    Difficulty of Paying Living Expenses: Not hard at all  Food Insecurity: No Food Insecurity (05/01/2023)   Hunger Vital Sign    Worried About Running Out of Food in the Last Year: Never true    Ran Out of Food in the Last Year: Never true  Transportation Needs: No Transportation Needs (05/01/2023)   PRAPARE - Administrator, Civil Service (Medical): No    Lack of Transportation (Non-Medical): No  Physical Activity: Sufficiently Active (05/01/2023)   Exercise Vital Sign    Days of Exercise per Week: 7 days    Minutes of Exercise per Session: 40 min  Stress: No Stress Concern Present (05/01/2023)   [employer] of Occupational Health - Occupational Stress Questionnaire    Feeling of Stress : Not at all  Social Connections: Socially Integrated (05/01/2023)   Social Connection and Isolation Panel [NHANES]    Frequency of Communication with Friends and Family: More than three times a week    Frequency of Social Gatherings with Friends and Family: More than three times a week    Attends Religious Services: More than 4 times per year    Active Member of [employer] or Organizations: Yes    Attends Engineer, structural: More than 4 times per year    Marital Status: Married    Tobacco Counseling Counseling given: Not Answered    Clinical Intake:  Pre-visit preparation completed: Yes  Pain : No/denies pain     BMI - recorded:  29.76 Nutritional Status: BMI 25 -29 Overweight Nutritional Risks: None Diabetes: No  Lab Results  Component Value Date   HGBA1C 6.0 11/24/2022   HGBA1C 6.1 10/26/2021   HGBA1C 6.0 10/19/2020     How often do you need to have someone help you when you read instructions, pamphlets, or other written materials from your doctor or pharmacy?: 1 - Never  Interpreter Needed?: No  Information entered by :: Alia T./CMA   Activities of Daily Living     05/01/2023    8:21 AM  In your present state of health, do you have any difficulty performing the following activities:  Hearing? 0  Vision? 0  Difficulty concentrating or making decisions?  0  Walking or climbing stairs? 0  Dressing or bathing? 0  Doing errands, shopping? 0  Preparing Food and eating ? N  Using the Toilet? N  In the past six months, have you accidently leaked urine? N  Do you have problems with loss of bowel control? N  Managing your Medications? N  Managing your Finances? N  Housekeeping or managing your Housekeeping? N    Patient Care Team: Allegra Grana, FNP as PCP - General (Family Medicine)  Indicate any recent Medical Services you may have received from other than [facility] providers in the past year (date may be approximate).     Assessment:   This is a routine wellness examination for [place].  Hearing/Vision screen Hearing Screening - Comments:: Pt denies hearing def Vision Screening - Comments:: Pt denies vision def Pt goes to [facility] Dr. Willey Blade   Goals Addressed               This Visit's Progress     Maintain Healthy Lifestyle (pt-stated)   On track     Stay active. Healthy diet.      Patient Stated        Wants to start line dancing       Depression Screen     05/01/2023    8:17 AM 11/24/2022    8:10 AM 04/20/2022   11:07 AM 04/18/2021    9:10 AM 04/21/2020    9:38 AM 04/15/2020    9:11 AM 04/15/2019    9:10 AM  PHQ 2/9 Scores  PHQ - 2 Score 0 0 0 0 0 0 0  PHQ-  9 Score 0 0   1      Fall Risk     05/01/2023    8:17 AM 11/24/2022    8:10 AM 04/20/2022   11:08 AM 04/18/2021    9:12 AM 04/15/2020    9:14 AM  Fall Risk   Falls in the past year? 0 0 0 0 0  Number falls in past yr: 0 0 0  0  Injury with Fall? 0 0 0  0  Risk for fall due to : No Fall Risks No Fall Risks     Follow up Falls prevention discussed;Falls evaluation completed Falls evaluation completed Falls evaluation completed;Falls prevention discussed Falls evaluation completed Falls evaluation completed    MEDICARE RISK AT HOME:  Medicare Risk at Home Any stairs in or around the home?: Yes If so, are there any without handrails?: Yes Home free of loose throw rugs in walkways, pet beds, electrical cords, etc?: Yes Adequate lighting in your home to reduce risk of falls?: Yes Life alert?: Yes (system in place to get emgergency help) Use of a cane, walker or w/c?: No Grab bars in the bathroom?: Yes Shower chair or bench in shower?: Yes Elevated toilet seat or a handicapped toilet?: No  TIMED UP AND GO:  Was the test performed?  No  Cognitive Function: 6CIT completed    04/15/2019    9:26 AM  MMSE - Mini Mental State Exam  Not completed: Unable to complete        05/01/2023    8:33 AM 04/20/2022   11:08 AM 04/15/2019    9:34 AM  6CIT Screen  What Year? 0 points 0 points 0 points  What month? 0 points 0 points 0 points  What time? 0 points 0 points 0 points  Count back from 20 0 points 0 points   Months  in reverse 0 points 0 points   Repeat phrase 0 points 0 points   Total Score 0 points 0 points     Immunizations Immunization History  Administered Date(s) Administered   Fluad Quad(high Dose 65+) 02/12/2019, 12/09/2019, 10/19/2020   PFIZER(Purple Top)SARS-COV-2 Vaccination 03/17/2019, 04/05/2019, 02/04/2020   PNEUMOCOCCAL CONJUGATE-20 10/19/2020   Pneumococcal Polysaccharide-23 02/11/2007   Zoster Recombinant(Shingrix) 04/26/2021    Screening Tests Health  Maintenance  Topic Date Due   DTaP/Tdap/Td (1 - Tdap) Never done   Zoster Vaccines- Shingrix (2 of 2) 06/21/2021   INFLUENZA VACCINE  05/07/2023 (Originally 09/07/2022)   COVID-19 Vaccine (4 - 2024-25 season) 05/16/2024 (Originally 10/08/2022)   Medicare Annual Wellness (AWV)  04/30/2024   Colonoscopy  05/21/2024   MAMMOGRAM  12/27/2024   Pneumonia Vaccine 41+ Years old  Completed   DEXA SCAN  Completed   Hepatitis C Screening  Completed   HPV VACCINES  Aged Out    Health Maintenance  Health Maintenance Due  Topic Date Due   DTaP/Tdap/Td (1 - Tdap) Never done   Zoster Vaccines- Shingrix (2 of 2) 06/21/2021   Health Maintenance Items Addressed: See Nurse Notes  Additional Screening:  Vision Screening: Recommended annual ophthalmology exams for early detection of glaucoma and other disorders of the eye.  Dental Screening: Recommended annual dental exams for proper oral hygiene  Community Resource Referral / Chronic Care Management: CRR required this visit?  No   CCM required this visit?  No     Plan:     I have personally reviewed and noted the following in the patient's chart:   Medical and social history Use of alcohol, tobacco or illicit drugs  Current medications and supplements including opioid prescriptions. Patient is not currently taking opioid prescriptions. Functional ability and status Nutritional status Physical activity Advanced directives List of other physicians Hospitalizations, surgeries, and ER visits in previous 12 months Vitals Screenings to include cognitive, depression, and falls Referrals and appointments  In addition, I have reviewed and discussed with patient certain preventive protocols, quality metrics, and best practice recommendations. A written personalized care plan for preventive services as well as general preventive health recommendations were provided to patient.     Arta Silence, CMA   05/01/2023   After Visit Summary:  (Declined) Due to this being a telephonic visit, with patients personalized plan was offered to patient but patient Declined AVS at this time   Notes: Please refer to Routing Comments.

## 2023-05-01 NOTE — Patient Instructions (Signed)
 Valerie Larson , Thank you for taking time to come for your Medicare Wellness Visit. I appreciate your ongoing commitment to your health goals. Please review the following plan we discussed and let me know if I can assist you in the future.   Referrals/Orders/Follow-Ups/Clinician Recommendations: Remember to have your Shingles and Tetanus vaccine at your next follow up with your provider.  This is a list of the screening recommended for you and due dates:  Health Maintenance  Topic Date Due   DTaP/Tdap/Td vaccine (1 - Tdap) Never done   Zoster (Shingles) Vaccine (2 of 2) 06/21/2021   Flu Shot  05/07/2023*   COVID-19 Vaccine (4 - 2024-25 season) 05/16/2024*   Medicare Annual Wellness Visit  04/30/2024   Colon Cancer Screening  05/21/2024   Mammogram  12/27/2024   Pneumonia Vaccine  Completed   DEXA scan (bone density measurement)  Completed   Hepatitis C Screening  Completed   HPV Vaccine  Aged Out  *Topic was postponed. The date shown is not the original due date.    Advanced directives: (Declined) Advance directive discussed with you today. Even though you declined this today, please call our office should you change your mind, and we can give you the proper paperwork for you to fill out.  Next Medicare Annual Wellness Visit scheduled for next year: Yes

## 2023-05-28 ENCOUNTER — Ambulatory Visit
Admission: RE | Admit: 2023-05-28 | Discharge: 2023-05-28 | Disposition: A | Discharge status: Home / Self Care | Source: Ambulatory Visit | Attending: Family | Admitting: Family

## 2023-05-28 DIAGNOSIS — M8589 Other specified disorders of bone density and structure, multiple sites: Secondary | ICD-10-CM | POA: Diagnosis not present

## 2023-05-28 DIAGNOSIS — Z780 Asymptomatic menopausal state: Secondary | ICD-10-CM | POA: Diagnosis not present

## 2023-06-02 ENCOUNTER — Encounter: Payer: Self-pay | Admitting: Family

## 2023-09-04 ENCOUNTER — Other Ambulatory Visit: Payer: Self-pay | Admitting: Family

## 2023-09-04 DIAGNOSIS — Z9109 Other allergy status, other than to drugs and biological substances: Secondary | ICD-10-CM

## 2023-10-11 DIAGNOSIS — H2513 Age-related nuclear cataract, bilateral: Secondary | ICD-10-CM | POA: Diagnosis not present

## 2023-10-11 DIAGNOSIS — H353132 Nonexudative age-related macular degeneration, bilateral, intermediate dry stage: Secondary | ICD-10-CM | POA: Diagnosis not present

## 2023-10-26 ENCOUNTER — Other Ambulatory Visit: Payer: Self-pay | Admitting: Family

## 2023-10-26 DIAGNOSIS — I10 Essential (primary) hypertension: Secondary | ICD-10-CM

## 2023-12-05 ENCOUNTER — Encounter: Payer: Self-pay | Admitting: Family

## 2023-12-05 ENCOUNTER — Ambulatory Visit (INDEPENDENT_AMBULATORY_CARE_PROVIDER_SITE_OTHER): Admitting: Family

## 2023-12-05 VITALS — BP 118/70 | HR 85 | Temp 98.3°F | Ht 63.0 in | Wt 169.0 lb

## 2023-12-05 DIAGNOSIS — G8929 Other chronic pain: Secondary | ICD-10-CM | POA: Diagnosis not present

## 2023-12-05 DIAGNOSIS — Z136 Encounter for screening for cardiovascular disorders: Secondary | ICD-10-CM

## 2023-12-05 DIAGNOSIS — R7303 Prediabetes: Secondary | ICD-10-CM

## 2023-12-05 DIAGNOSIS — E785 Hyperlipidemia, unspecified: Secondary | ICD-10-CM

## 2023-12-05 DIAGNOSIS — Z8639 Personal history of other endocrine, nutritional and metabolic disease: Secondary | ICD-10-CM

## 2023-12-05 DIAGNOSIS — M545 Low back pain, unspecified: Secondary | ICD-10-CM | POA: Diagnosis not present

## 2023-12-05 DIAGNOSIS — I10 Essential (primary) hypertension: Secondary | ICD-10-CM | POA: Diagnosis not present

## 2023-12-05 DIAGNOSIS — Z87898 Personal history of other specified conditions: Secondary | ICD-10-CM | POA: Diagnosis not present

## 2023-12-05 DIAGNOSIS — E559 Vitamin D deficiency, unspecified: Secondary | ICD-10-CM | POA: Diagnosis not present

## 2023-12-05 DIAGNOSIS — Z9109 Other allergy status, other than to drugs and biological substances: Secondary | ICD-10-CM | POA: Diagnosis not present

## 2023-12-05 DIAGNOSIS — Z1231 Encounter for screening mammogram for malignant neoplasm of breast: Secondary | ICD-10-CM

## 2023-12-05 DIAGNOSIS — Z13220 Encounter for screening for lipoid disorders: Secondary | ICD-10-CM | POA: Diagnosis not present

## 2023-12-05 LAB — COMPREHENSIVE METABOLIC PANEL WITH GFR
ALT: 10 U/L (ref 0–35)
AST: 17 U/L (ref 0–37)
Albumin: 3.9 g/dL (ref 3.5–5.2)
Alkaline Phosphatase: 70 U/L (ref 39–117)
BUN: 12 mg/dL (ref 6–23)
CO2: 32 meq/L (ref 19–32)
Calcium: 10 mg/dL (ref 8.4–10.5)
Chloride: 103 meq/L (ref 96–112)
Creatinine, Ser: 1.09 mg/dL (ref 0.40–1.20)
GFR: 51.39 mL/min — ABNORMAL LOW (ref >60.00)
Glucose, Bld: 106 mg/dL — ABNORMAL HIGH (ref 70–99)
Potassium: 3.8 meq/L (ref 3.5–5.1)
Sodium: 141 meq/L (ref 135–145)
Total Bilirubin: 1.1 mg/dL (ref 0.2–1.2)
Total Protein: 6.8 g/dL (ref 6.0–8.3)

## 2023-12-05 LAB — CBC WITH DIFFERENTIAL/PLATELET
Basophils Absolute: 0.1 K/uL (ref 0.0–0.1)
Basophils Relative: 1.4 % (ref 0.0–3.0)
Eosinophils Absolute: 0.1 K/uL (ref 0.0–0.7)
Eosinophils Relative: 1.8 % (ref 0.0–5.0)
HCT: 43.5 % (ref 36.0–46.0)
Hemoglobin: 14.7 g/dL (ref 12.0–15.0)
Lymphocytes Relative: 36.3 % (ref 12.0–46.0)
Lymphs Abs: 2.5 K/uL (ref 0.7–4.0)
MCHC: 33.8 g/dL (ref 30.0–36.0)
MCV: 92.7 fl (ref 78.0–100.0)
Monocytes Absolute: 0.5 K/uL (ref 0.1–1.0)
Monocytes Relative: 6.4 % (ref 3.0–12.0)
Neutro Abs: 3.8 K/uL (ref 1.4–7.7)
Neutrophils Relative %: 54.1 % (ref 43.0–77.0)
Platelets: 329 K/uL (ref 150.0–400.0)
RBC: 4.7 Mil/uL (ref 3.87–5.11)
RDW: 13.3 % (ref 11.5–15.5)
WBC: 7 K/uL (ref 4.0–10.5)

## 2023-12-05 LAB — LIPID PANEL
Cholesterol: 136 mg/dL (ref 0–200)
HDL: 35.6 mg/dL — ABNORMAL LOW (ref >39.00)
LDL Cholesterol: 65 mg/dL (ref 0–99)
NonHDL: 100.02
Total CHOL/HDL Ratio: 4
Triglycerides: 176 mg/dL — ABNORMAL HIGH (ref 0.0–149.0)
VLDL: 35.2 mg/dL (ref 0.0–40.0)

## 2023-12-05 LAB — HEMOGLOBIN A1C: Hgb A1c MFr Bld: 6.2 % (ref 4.6–6.5)

## 2023-12-05 LAB — VITAMIN D 25 HYDROXY (VIT D DEFICIENCY, FRACTURES): VITD: 28.88 ng/mL — ABNORMAL LOW (ref 30.00–100.00)

## 2023-12-05 LAB — TSH: TSH: 4.09 u[IU]/mL (ref 0.35–5.50)

## 2023-12-05 MED ORDER — ALBUTEROL SULFATE HFA 108 (90 BASE) MCG/ACT IN AERS: INHALATION_SPRAY | RESPIRATORY_TRACT | 1 refills | Status: AC

## 2023-12-05 MED ORDER — AMLODIPINE-ATORVASTATIN 5-20 MG PO TABS: 1.0000 | ORAL_TABLET | Freq: Every evening | ORAL | 3 refills | Status: AC

## 2023-12-05 NOTE — Patient Instructions (Addendum)
 Please call  and schedule your 3D mammogram and /or bone density scan as we discussed.   [facility]  ( new location in 2023)  [address], [place], [place] [address]  [place], [place]  [phone]    Take acetaminophen ( tylenol) 650mg  ( this is tylenol arthritis) daily to twice daily; I would favor this over mobic  as it is an NSAID ( like ibuprofen).    Hip Bursitis Rehab Ask your health care provider which exercises are safe for you. Do exercises exactly as told by your health care provider and adjust them as directed. It is normal to feel mild stretching, pulling, tightness, or discomfort as you do these exercises. Stop right away if you feel sudden pain or your pain gets worse. Do not begin these exercises until told by your health care provider. Stretching exercise This exercise warms up your muscles and joints and improves the movement and flexibility of your hip. This exercise also helps to relieve pain and stiffness. Iliotibial band stretch An iliotibial band is a strong band of muscle tissue that runs from the outer side of your hip to the outer side of your thigh and knee. Lie on your side with your left / right leg in the top position. Bend your left / right knee and grab your ankle. Stretch out your bottom arm to help you balance. Slowly bring your knee back so your thigh is slightly behind your body. Slowly lower your knee toward the floor until you feel a gentle stretch on the outside of your left / right thigh. If you do not feel a stretch and your knee will not lower more toward the floor, place the heel of your other foot on top of your knee and pull your knee down toward the floor with your foot. Hold this position for __________ seconds. Slowly return to the starting position. Repeat __________ times. Complete this exercise __________ times a day. Strengthening exercises These exercises build strength and endurance in your hip and pelvis.  Endurance is the ability to use your muscles for a long time, even after they get tired. Bridge This exercise strengthens the muscles that move your thigh backward (hip extensors). Lie on your back on a firm surface with your knees bent and your feet flat on the floor. Tighten your buttocks muscles and lift your buttocks off the floor until your trunk is level with your thighs. Do not arch your back. You should feel the muscles working in your buttocks and the back of your thighs. If you do not feel these muscles, slide your feet 1-2 inches (2.5-5 cm) farther away from your buttocks. If this exercise is too easy, try doing it with your arms crossed over your chest. Hold this position for __________ seconds. Slowly lower your hips to the starting position. Let your muscles relax completely after each repetition. Repeat __________ times. Complete this exercise __________ times a day. Squats This exercise strengthens the muscles in front of your thigh and knee (quadriceps). Stand in front of a table, with your feet and knees pointing straight ahead. You may rest your hands on the table for balance but not for support. Slowly bend your knees and lower your hips like you are going to sit in a chair. Keep your weight over your heels, not over your toes. Keep your lower legs upright so they are parallel with the table legs. Do not let your hips go lower than your knees. Do not bend lower  than told by your health care provider. If your hip pain increases, do not bend as low. Hold the squat position for __________ seconds. Slowly push with your legs to return to standing. Do not use your hands to pull yourself to standing. Repeat __________ times. Complete this exercise __________ times a day. Hip hike  Stand sideways on a bottom step. Stand on your left / right leg with your other foot unsupported next to the step. You can hold on to the railing or wall for balance if needed. Keep your knees  straight and your torso square. Then lift your left / right hip up toward the ceiling. Hold this position for __________ seconds. Slowly let your left / right hip lower toward the floor, past the starting position. Your foot should get closer to the floor. Do not lean or bend your knees. Repeat __________ times. Complete this exercise __________ times a day. Single leg stand This exercise increases your balance. Without shoes, stand near a railing or in a doorway. You may hold on to the railing or door frame as needed for balance. Squeeze your left / right buttock muscles, then lift up your other foot. Do not let your left / right hip push out to the side. It is helpful to stand in front of a mirror for this exercise so you can watch your hip. Hold this position for __________ seconds. Repeat __________ times. Complete this exercise __________ times a day. This information is not intended to replace advice given to you by your health care provider. Make sure you discuss any questions you have with your health care provider. Document Revised: 01/05/2021 Document Reviewed: 01/05/2021 Elsevier Patient Education  2024 [employer].

## 2023-12-05 NOTE — Assessment & Plan Note (Signed)
chronic, presumed stable.  Pending lipid panel.  Continue atorvastatin 20 mg

## 2023-12-05 NOTE — Assessment & Plan Note (Signed)
 Reviewed prior hip xray. Discussed presentation c/w trochanteric bursitis. Provided exercises on AVS; she declines PT at this time.  Advised tylenol over mobic  due to prior decrease in GFR.

## 2023-12-05 NOTE — Progress Notes (Signed)
 Assessment & Plan:  Essential hypertension Assessment & Plan: Chronic , stable. Continue amlodipine  5mg  every day, triamterene  HTTZ 37.5-25mg .   Orders: -     TSH -     Comprehensive metabolic panel with GFR -     CBC with Differential/Platelet -     VITAMIN D  25 Hydroxy (Vit-D Deficiency, Fractures) -     amLODIPine -Atorvastatin ; Take 1 tablet by mouth every evening.  Dispense: 90 tablet; Refill: 3  Encounter for lipid screening for cardiovascular disease -     Lipid panel  History of prediabetes -     Hemoglobin A1c  Prediabetes -     Hemoglobin A1c  History of vitamin D  deficiency -     VITAMIN D  25 Hydroxy (Vit-D Deficiency, Fractures)  Vitamin D  deficiency -     VITAMIN D  25 Hydroxy (Vit-D Deficiency, Fractures)  Environmental allergies -     Albuterol  Sulfate HFA; TAKE 2 PUFFS BY MOUTH EVERY 6 HOURS AS NEEDED FOR WHEEZE OR SHORTNESS OF BREATH  Dispense: 6.7 each; Refill: 1  Encounter for screening mammogram for malignant neoplasm of breast -     3D Screening Mammogram, Left and Right  Hyperlipidemia, unspecified hyperlipidemia type Assessment & Plan:  chronic, presumed stable.  Pending lipid panel.  Continue atorvastatin  20 mg  Orders: -     amLODIPine -Atorvastatin ; Take 1 tablet by mouth every evening.  Dispense: 90 tablet; Refill: 3  Chronic bilateral low back pain without sciatica Assessment & Plan: Reviewed prior hip xray. Discussed presentation c/w trochanteric bursitis. Provided exercises on AVS; she declines PT at this time.  Advised tylenol over mobic  due to prior decrease in GFR.      Return precautions given.   Risks, benefits, and alternatives of the medications and treatment plan prescribed today were discussed, and patient expressed understanding.   Education regarding symptom management and diagnosis given to patient on AVS either electronically or printed.  Return in about 6 months (around 06/04/2024).  Rollene Northern,  FNP  Subjective:    Patient ID: Valerie  LITTIE Larson, female    DOB: 1955-07-25, 68 y.o.   MRN: [id number]  CC: Valerie Larson is a 68 y.o. female who presents today for follow up.   HPI: HPI Discussed the use of AI scribe software for clinical note transcription with the patient, who gave verbal consent to proceed.  History of Present Illness   Valerie Larson is a 68 year old female with hypertension who presents for HTN follow-up   She is currently taking triamterene  hydrochlorothiazide , amlodipine , and atorvastatin , and uses an albuterol  inhaler as needed.   She continues to have bilateral  lateral hip pain, described as a burning sensation that prevents her from sleeping on either side. Denies groin pain, numbness  She recently returned from a cruise and plans to continue staying active. No chest pain or shortness of breath. Her blood pressure remains stable, typically around 118/70.     Xr BL hips negative 11/28/22  Allergies: Chantix [varenicline] Current Outpatient Medications on File Prior to Visit  Medication Sig Dispense Refill   Azelastine  HCl 137 MCG/SPRAY SOLN INSTILL 2 SPRAYS INTO BOTH NOSTRILS EVERY 12 HOURS AS NEEDED 30 mL 5   loratadine (CLARITIN) 10 MG tablet Take 10 mg by mouth daily.     Multiple Vitamins-Minerals (PRESERVISION AREDS 2+MULTI VIT PO) Take 1 tablet by mouth.     meloxicam  (MOBIC ) 7.5 MG tablet Take 1 tablet (7.5 mg total) by mouth daily as needed for pain. (  Patient not taking: Reported on 05/01/2023) 30 tablet 2   triamterene -hydrochlorothiazide  (MAXZIDE-25) 37.5-25 MG tablet TAKE 1 TABLET BY MOUTH EVERY DAY 90 tablet 3   No current facility-administered medications on file prior to visit.    Review of Systems  Constitutional:  Negative for chills and fever.  Respiratory:  Negative for cough.   Cardiovascular:  Negative for chest pain and palpitations.  Gastrointestinal:  Negative for nausea and vomiting.      Objective:    BP 118/70   Pulse 85    Temp 98.3 F (36.8 C) (Oral)   Ht 5' 3 (1.6 m)   Wt 169 lb (76.7 kg)   SpO2 96%   BMI 29.94 kg/m  BP Readings from Last 3 Encounters:  12/05/23 118/70  05/01/23 114/82  11/24/22 116/72   Wt Readings from Last 3 Encounters:  12/05/23 169 lb (76.7 kg)  05/01/23 168 lb (76.2 kg)  11/24/22 168 lb 12.8 oz (76.6 kg)    Physical Exam Vitals reviewed.  Constitutional:      Appearance: She is well-developed.  Eyes:     Conjunctiva/sclera: Conjunctivae normal.  Cardiovascular:     Rate and Rhythm: Normal rate and regular rhythm.     Pulses: Normal pulses.     Heart sounds: Normal heart sounds.  Pulmonary:     Effort: Pulmonary effort is normal.     Breath sounds: Normal breath sounds. No wheezing, rhonchi or rales.  Skin:    General: Skin is warm and dry.  Neurological:     Mental Status: She is alert.  Psychiatric:        Speech: Speech normal.        Behavior: Behavior normal.        Thought Content: Thought content normal.

## 2023-12-05 NOTE — Assessment & Plan Note (Signed)
 Chronic , stable. Continue amlodipine  5mg  every day, triamterene  HTTZ 37.5-25mg .

## 2023-12-10 DIAGNOSIS — Z23 Encounter for immunization: Secondary | ICD-10-CM | POA: Diagnosis not present

## 2023-12-18 ENCOUNTER — Ambulatory Visit: Payer: Self-pay | Admitting: Family

## 2023-12-18 DIAGNOSIS — E876 Hypokalemia: Secondary | ICD-10-CM

## 2024-01-27 ENCOUNTER — Other Ambulatory Visit: Payer: Self-pay | Admitting: Family

## 2024-01-27 DIAGNOSIS — Z9109 Other allergy status, other than to drugs and biological substances: Secondary | ICD-10-CM

## 2024-01-28 NOTE — Telephone Encounter (Signed)
 Spoke with pt and she stated that she does not need this refilled at the time.

## 2024-02-08 ENCOUNTER — Other Ambulatory Visit

## 2024-02-08 DIAGNOSIS — E876 Hypokalemia: Secondary | ICD-10-CM

## 2024-02-08 LAB — BASIC METABOLIC PANEL WITH GFR
BUN: 15 mg/dL (ref 6–23)
CO2: 33 meq/L — ABNORMAL HIGH (ref 19–32)
Calcium: 10 mg/dL (ref 8.4–10.5)
Chloride: 104 meq/L (ref 96–112)
Creatinine, Ser: 1.05 mg/dL (ref 0.40–1.20)
GFR: 53.68 mL/min — ABNORMAL LOW
Glucose, Bld: 106 mg/dL — ABNORMAL HIGH (ref 70–99)
Potassium: 4.1 meq/L (ref 3.5–5.1)
Sodium: 142 meq/L (ref 135–145)

## 2024-02-11 ENCOUNTER — Telehealth: Payer: Self-pay

## 2024-02-11 ENCOUNTER — Ambulatory Visit: Payer: Self-pay | Admitting: Family

## 2024-02-11 DIAGNOSIS — R7303 Prediabetes: Secondary | ICD-10-CM

## 2024-02-11 DIAGNOSIS — Z87898 Personal history of other specified conditions: Secondary | ICD-10-CM

## 2024-02-11 DIAGNOSIS — E876 Hypokalemia: Secondary | ICD-10-CM

## 2024-02-11 NOTE — Telephone Encounter (Signed)
 Can we have pt scheduled a lab appt for BMP , Urine protein and A1c orders are in

## 2024-02-11 NOTE — Telephone Encounter (Signed)
 See other phone note (duplicate)  Original message attached & I will send mychart to patient to have them call back to schedule appt  Norine Skelton, CMA routed this conversation to [facility] Norine Skelton, [facility]    02/11/24  1:11 PM Note Copied from CRM #[id number]. Topic: Clinical - Request for Lab/Test Order >> Feb 11, 2024 11:27 AM Hadassah PARAS wrote: Reason for CRM: Pt was advised by PCP to schedule blood work 50m from [employer] lab work. Please advise pto n [phone]

## 2024-02-11 NOTE — Telephone Encounter (Signed)
 See other message. A second telephone note was created instead of adding to the original.  Closing this message out

## 2024-02-11 NOTE — Telephone Encounter (Signed)
 Copied from CRM [id number]. Topic: Clinical - Request for Lab/Test Order >> Feb 11, 2024 11:27 AM Hadassah PARAS wrote: Reason for CRM: Pt was advised by PCP to schedule blood work 61m from [employer] lab work. Please advise pto n [phone]

## 2024-02-19 ENCOUNTER — Ambulatory Visit
Admission: RE | Admit: 2024-02-19 | Discharge: 2024-02-19 | Disposition: A | Discharge status: Home / Self Care | Source: Ambulatory Visit | Attending: Family | Admitting: Family

## 2024-02-19 DIAGNOSIS — Z1231 Encounter for screening mammogram for malignant neoplasm of breast: Secondary | ICD-10-CM | POA: Diagnosis present

## 2024-02-21 ENCOUNTER — Other Ambulatory Visit: Payer: Self-pay | Admitting: Family

## 2024-02-21 DIAGNOSIS — R928 Other abnormal and inconclusive findings on diagnostic imaging of breast: Secondary | ICD-10-CM

## 2024-02-27 ENCOUNTER — Ambulatory Visit
Admission: RE | Admit: 2024-02-27 | Discharge: 2024-02-27 | Disposition: A | Discharge status: Home / Self Care | Source: Ambulatory Visit | Attending: Family | Admitting: Family

## 2024-02-27 ENCOUNTER — Ambulatory Visit
Admission: RE | Admit: 2024-02-27 | Discharge: 2024-02-27 | Disposition: A | Source: Ambulatory Visit | Attending: Family

## 2024-02-27 DIAGNOSIS — R928 Other abnormal and inconclusive findings on diagnostic imaging of breast: Secondary | ICD-10-CM | POA: Insufficient documentation

## 2024-03-09 ENCOUNTER — Other Ambulatory Visit: Payer: Self-pay | Admitting: Family

## 2024-03-09 DIAGNOSIS — Z9109 Other allergy status, other than to drugs and biological substances: Secondary | ICD-10-CM

## 2024-04-30 ENCOUNTER — Encounter

## 2024-05-14 ENCOUNTER — Other Ambulatory Visit

## 2024-06-18 ENCOUNTER — Ambulatory Visit: Admitting: Family
